# Patient Record
Sex: Female | Born: 2000 | Hispanic: Yes | Marital: Married | Smoking: Former smoker
Health system: Southern US, Community
[De-identification: ages and names within clinical notes are randomized; demographics above are authoritative.]

## PROBLEM LIST (undated history)

## (undated) ENCOUNTER — Inpatient Hospital Stay (HOSPITAL_COMMUNITY): Payer: Self-pay

## (undated) DIAGNOSIS — E282 Polycystic ovarian syndrome: Secondary | ICD-10-CM

## (undated) DIAGNOSIS — F419 Anxiety disorder, unspecified: Secondary | ICD-10-CM

## (undated) DIAGNOSIS — F909 Attention-deficit hyperactivity disorder, unspecified type: Secondary | ICD-10-CM

## (undated) DIAGNOSIS — F329 Major depressive disorder, single episode, unspecified: Secondary | ICD-10-CM

## (undated) DIAGNOSIS — F32A Depression, unspecified: Secondary | ICD-10-CM

## (undated) HISTORY — DX: Depression, unspecified: F32.A

## (undated) HISTORY — DX: Anxiety disorder, unspecified: F41.9

## (undated) HISTORY — PX: EYE MUSCLE SURGERY: SHX370

---
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
## 2019-07-10 ENCOUNTER — Ambulatory Visit
Admission: RE | Admit: 2019-07-10 | Discharge: 2019-07-10 | Disposition: A | Discharge status: Home / Self Care | Payer: Self-pay | Source: Ambulatory Visit | Attending: Family Medicine | Admitting: Family Medicine

## 2019-07-10 ENCOUNTER — Encounter: Payer: Self-pay | Admitting: Family Medicine

## 2019-07-10 ENCOUNTER — Other Ambulatory Visit: Payer: Self-pay

## 2019-07-10 ENCOUNTER — Ambulatory Visit (INDEPENDENT_AMBULATORY_CARE_PROVIDER_SITE_OTHER): Payer: Medicaid Other | Admitting: Family Medicine

## 2019-07-10 VITALS — BP 102/70 | HR 80 | Ht 64.0 in | Wt 173.1 lb

## 2019-07-10 DIAGNOSIS — Z23 Encounter for immunization: Secondary | ICD-10-CM

## 2019-07-10 DIAGNOSIS — M79672 Pain in left foot: Secondary | ICD-10-CM | POA: Diagnosis present

## 2019-07-10 DIAGNOSIS — Z0000 Encounter for general adult medical examination without abnormal findings: Secondary | ICD-10-CM | POA: Diagnosis not present

## 2019-07-10 DIAGNOSIS — F329 Major depressive disorder, single episode, unspecified: Secondary | ICD-10-CM | POA: Diagnosis not present

## 2019-07-10 DIAGNOSIS — F419 Anxiety disorder, unspecified: Secondary | ICD-10-CM

## 2019-07-10 DIAGNOSIS — Z8742 Personal history of other diseases of the female genital tract: Secondary | ICD-10-CM | POA: Diagnosis not present

## 2019-07-10 MED ORDER — BUSPIRONE HCL 5 MG PO TABS: 5.0000 mg | ORAL_TABLET | Freq: Every day | ORAL | 3 refills | Status: DC

## 2019-07-10 MED ORDER — NORGESTIM-ETH ESTRAD TRIPHASIC 0.18/0.215/0.25 MG-35 MCG PO TABS: 1.0000 | ORAL_TABLET | Freq: Every day | ORAL | 6 refills | Status: DC

## 2019-07-10 NOTE — Assessment & Plan Note (Addendum)
Patient not at age for pap smear as of yet. Blood pressure within normal range.   -Influenza vaccine given today  -collected recommended screen for HIV

## 2019-07-10 NOTE — Progress Notes (Signed)
  Patient Name: Molly Mendez Date of Birth: 01-22-2001 Date of Visit: 07/13/19 PCP: Stark Klein, MD  Chief Complaint: left foot pain  Subjective: Molly Mendez is a 18 y.o. with medical history significant for anxiety and depression presenting today for new patient appointment to establish care.   Foot Pain  RONNY RUDDELL states she has been experiencing pain in her left foot after a fall at work. This fall occurred 3-4 months ago while walking down the stairs and carrying a vacuum cleaner. Since the fall, patient reports experiencing plantar surface pain that is made worse with walking. She does not identify a particular point in her ambulation cycle where the pain is worse. She reports using vapor rub on her feet but has not had any relief with this. Patient has been wearing flip flops since the fall in order to prevent pain, she adds that at the time of the fall, her foot was swollen but not erythematous and she denies hearing any cracks or pops.   Hx of PCOS Patient reports being diagnosed with PCOS. She states that she recently lost weight with diet and exercise and is not currently prescribed an OCP. She reports hirsuitism. Patient states that she would like to be started on an OCP.   Hx of Anxiety and Depression  Patient states that she mostly experiences depression and was previously seen at [facility] for management of her buspirone medication. She reports that this prescription has been helpful with her anxiety. Patient is requesting refill for her buspirone.  PHQ9 score of 10.  GAD Score of 7.  I have reviewed the patient's medical, surgical, family, and social history as appropriate.  Vitals:   07/10/19 1358  BP: 102/70  Pulse: 80  SpO2: 99%    Physical Exam:   General: Alert and cooperative and appears to be in no acute distress HEENT: Neck non-tender without lymphadenopathy, masses or thyromegaly, turbinates do not appear edematous or  boggy, no oropharyngeal erythema or petechiae  Cardio: Normal S1 and S2, no S3 or S4. Rhythm is regular. No murmurs or rubs.   Pulm: Clear to auscultation bilaterally, no crackles, wheezing, or diminished breath sounds. Normal respiratory effort Abdomen: Bowel sounds normal. Abdomen soft and non-tender.  Extremities: No peripheral edema. Warm/ well perfused.  Strong radial and pedal pulses.Left ankles with no tenderness or erythema, tenderness in plantar aspect of left foot, patient noted to walk with painful gait  Assessment & Plan:   Healthcare maintenance Patient not at age for pap smear as of yet. Blood pressure within normal range.   -Influenza vaccine given today  -collected recommended screen for HIV   Foot pain, left Patient reports foot pain for 3-4 months falling a fall. Patient reports pain with walking throughout cycle of foot strike, pain is concentrated in area of plantar fascia.  -encourage patient to try Tylenol, RICE method and stretching  -left foot xray (no fracture or dislocation) -ambulatory referral to sports medicine   History of PCOS Patient reports history of PCOS and reports not currently being on contraception. Patient also reports having vaginal bleeding twice per month with hirsutism.  -prescribe Sprintec   Anxiety and depression Patient controlled on Buspirone 5mg  daily. Refills submitted.   Return to care as needed.   Stark Klein, MD  Family Medicine  PGY1

## 2019-07-10 NOTE — Patient Instructions (Addendum)
It was a pleasure to see you today! Thank you for choosing [facility] for your primary care. Molly Mendez was seen for new patient appointment to establish care.   Our plans for today were:  PCOS- sending a prescription for birth control.   Foot Pain- I am referring you to sports medicine to further evaluate your foot pain. I have also sent in a request for you to have an xray of your foot.    I recommend that you use flonase as we discussed to help with clearing your nasal passages. Please follow up with me in 2 weeks and we can further discuss this if there are no changes.   To keep you healthy, we need to monitor some screening tests.   You are due for :  1. Influenza - given today  2. HIV screening, completed today.    You should return to our clinic in 2 weeks to follow up on your new medications.   Best,  Dr. Rosita Fire

## 2019-07-11 LAB — HIV ANTIBODY (ROUTINE TESTING W REFLEX): HIV Screen 4th Generation wRfx: NONREACTIVE

## 2019-07-11 NOTE — Progress Notes (Signed)
Patient called and notified of results of xray of her foot showing no fracture or dislocation. Patient verbalized understanding.

## 2019-07-13 ENCOUNTER — Encounter: Payer: Self-pay | Admitting: Family Medicine

## 2019-07-13 DIAGNOSIS — Z8742 Personal history of other diseases of the female genital tract: Secondary | ICD-10-CM | POA: Insufficient documentation

## 2019-07-13 DIAGNOSIS — F419 Anxiety disorder, unspecified: Secondary | ICD-10-CM | POA: Insufficient documentation

## 2019-07-13 DIAGNOSIS — M79672 Pain in left foot: Secondary | ICD-10-CM | POA: Insufficient documentation

## 2019-07-13 DIAGNOSIS — F329 Major depressive disorder, single episode, unspecified: Secondary | ICD-10-CM | POA: Insufficient documentation

## 2019-07-13 NOTE — Assessment & Plan Note (Signed)
Patient reports foot pain for 3-4 months falling a fall. Patient reports pain with walking throughout cycle of foot strike, pain is concentrated in area of plantar fascia.  -encourage patient to try Tylenol, RICE method and stretching  -left foot xray (no fracture or dislocation) -ambulatory referral to sports medicine

## 2019-07-13 NOTE — Assessment & Plan Note (Addendum)
Patient controlled on Buspirone 5mg  daily. Refills submitted.

## 2019-07-13 NOTE — Assessment & Plan Note (Signed)
Patient reports history of PCOS and reports not currently being on contraception. Patient also reports having vaginal bleeding twice per month with hirsutism.  -prescribe Sprintec

## 2019-07-17 ENCOUNTER — Ambulatory Visit: Payer: Medicaid Other | Admitting: Pediatrics

## 2019-07-24 ENCOUNTER — Other Ambulatory Visit: Payer: Self-pay

## 2019-07-24 ENCOUNTER — Ambulatory Visit (INDEPENDENT_AMBULATORY_CARE_PROVIDER_SITE_OTHER): Payer: Medicaid Other | Admitting: Student in an Organized Health Care Education/Training Program

## 2019-07-24 ENCOUNTER — Encounter: Payer: Self-pay | Admitting: Student in an Organized Health Care Education/Training Program

## 2019-07-24 VITALS — BP 118/70 | HR 92

## 2019-07-24 DIAGNOSIS — L659 Nonscarring hair loss, unspecified: Secondary | ICD-10-CM

## 2019-07-24 NOTE — Patient Instructions (Signed)
It was a pleasure to see you today!  To summarize our discussion for this visit:  For your hair loss, I think this is less likely caused by your birth control.  At this time I would recommend taking a multivitamin and checking your thyroid hormone.  If you do not see improvement then please let [place] know and we can change her birth control.  For your skin rash I am going to prescribe you an antifungal medication.  If this does not improve it please come back and let us know.  I am refilling your omeprazole.   Call the clinic at [phone] if your symptoms worsen or you have any concerns.   Thank you for allowing me to take part in your care,  Dr. Doristine Mango

## 2019-07-24 NOTE — Progress Notes (Signed)
   Subjective:    Patient ID: Molly Mendez, female    DOB: Feb 11, 2001, 18 y.o.   MRN: [id number]  CC: Hair loss, rash, omeprazole refill  HPI:  Hair loss- endorses 2 weeks of increased hair loss without bald spots. Attributes to birth control pill prescribed 10/15. Notices more hair pulls out after showers. Has not noticed excess hair on clothing or pillow, does not break off when brushing hair. Does not wear hair in tight braids. No noticed hairloss from other parts of the body. Patient currently on period which is described as heavy. Patient has lost ~37 lbs in 4 months intentionally to treat PCOS. Eats ~1,900 calories per day and performs HIIT workouts 1+ times every day. Feels that she is more hyper than usual, no change in sleep habits. Also had a rash appear under breasts bilaterally 2 days ago which she attributes to using a new loofa. She discontinued use of the loofa and the rash has resolved. The rash would burn when she scratched it.   She also needs a refill of her omeprazole. This is not a medication on her medication list.   Smoking status reviewed   ROS: pertinent noted in the HPI   I have personally reviewed pertinent past medical history, surgical, family, and social history as appropriate.  Objective:  BP 118/70   Pulse 92   LMP 06/26/2019   SpO2 99%   Vitals and nursing note reviewed  General: NAD, pleasant, able to participate in exam Head: negative for patches or diffuse reduced scalp.  Cardiac: regular and slightly tachycardic, S1 S2 present. normal heart sounds, no murmurs. Respiratory: CTAB, normal effort, No wheezes, rales or rhonchi. No increased WOB Extremities: no edema or cyanosis. Skin: warm and dry, no rashes noted except-. Inframammary folds positive for raised erythematous rash with satellite lesions.  Neuro: alert, no obvious focal deficits Psych: Normal affect and mood  Assessment & Plan:   Hair loss Unclear etiology. Negative for  visible thinning on scalp. Checked TSH, CBC.  Recommended multivitamin Follow up if continues for repeat exam to see evidence of hair loss to further differentiate. Would recommend continuing birth control for now.  Orders Placed This Encounter  Procedures  . CBC  . TSH    Doristine Mango, [facility] PGY-2

## 2019-07-25 LAB — CBC
Hematocrit: 37.4 % (ref 34.0–46.6)
Hemoglobin: 12.3 g/dL (ref 11.1–15.9)
MCH: 25.9 pg — ABNORMAL LOW (ref 26.6–33.0)
MCHC: 32.9 g/dL (ref 31.5–35.7)
MCV: 79 fL (ref 79–97)
Platelets: 297 10*3/uL (ref 150–450)
RBC: 4.75 x10E6/uL (ref 3.77–5.28)
RDW: 12.9 % (ref 11.7–15.4)
WBC: 8 10*3/uL (ref 3.4–10.8)

## 2019-07-25 LAB — TSH: TSH: 1.89 u[IU]/mL (ref 0.450–4.500)

## 2019-07-28 DIAGNOSIS — L659 Nonscarring hair loss, unspecified: Secondary | ICD-10-CM | POA: Insufficient documentation

## 2019-07-28 MED ORDER — MICONAZOLE NITRATE 2 % EX CREA: 1.0000 "application " | TOPICAL_CREAM | Freq: Two times a day (BID) | CUTANEOUS | 0 refills | Status: DC

## 2019-07-28 NOTE — Assessment & Plan Note (Signed)
Unclear etiology. Negative for visible thinning on scalp. Checked TSH, CBC.  Recommended multivitamin Follow up if continues for repeat exam to see evidence of hair loss to further differentiate. Would recommend continuing birth control for now.

## 2020-02-25 ENCOUNTER — Other Ambulatory Visit: Payer: Self-pay

## 2020-02-25 ENCOUNTER — Ambulatory Visit (INDEPENDENT_AMBULATORY_CARE_PROVIDER_SITE_OTHER): Payer: Medicaid Other | Admitting: Family Medicine

## 2020-02-25 VITALS — BP 115/75 | HR 74 | Ht 62.0 in | Wt 177.4 lb

## 2020-02-25 DIAGNOSIS — R300 Dysuria: Secondary | ICD-10-CM

## 2020-02-25 DIAGNOSIS — N3000 Acute cystitis without hematuria: Secondary | ICD-10-CM

## 2020-02-25 DIAGNOSIS — N390 Urinary tract infection, site not specified: Secondary | ICD-10-CM | POA: Insufficient documentation

## 2020-02-25 HISTORY — DX: Urinary tract infection, site not specified: N39.0

## 2020-02-25 LAB — POCT UA - MICROSCOPIC ONLY

## 2020-02-25 LAB — POCT URINALYSIS DIP (MANUAL ENTRY)
Bilirubin, UA: NEGATIVE
Blood, UA: NEGATIVE
Glucose, UA: NEGATIVE mg/dL
Ketones, POC UA: NEGATIVE mg/dL
Leukocytes, UA: NEGATIVE
Nitrite, UA: POSITIVE — AB
Protein Ur, POC: NEGATIVE mg/dL
Spec Grav, UA: 1.02 (ref 1.010–1.025)
Urobilinogen, UA: 0.2 E.U./dL
pH, UA: 6 (ref 5.0–8.0)

## 2020-02-25 MED ORDER — CEPHALEXIN 500 MG PO CAPS: 500.0000 mg | ORAL_CAPSULE | Freq: Four times a day (QID) | ORAL | 0 refills | Status: AC

## 2020-02-25 NOTE — Progress Notes (Signed)
° ° °  SUBJECTIVE:   CHIEF COMPLAINT / HPI:   Dysuria  Patient noted 3 days ago she noted irritation with urination. First felt like itching but now is burning. Does note urinary frequency but no urgency. No hematuria. No vaginal discharge. Never has had a UTI. Did change to a scented soap recently. No rashes or sores. Patient is sexually active with same partner. No STD exposure. Always uses condoms. Not on birth control, would like to think about it before deciding on which form she wants. No fevers. No new back pain.   PERTINENT  PMH / PSH: h/o PCOS  OBJECTIVE:   BP 115/75    Pulse 74    Ht 5\' 2"  (1.575 m)    Wt 177 lb 6.4 oz (80.5 kg)    SpO2 99%    BMI 32.45 kg/m   Gen: awake and alert, NAD Cardio: RRR, no MRG Resp: CTAB, no wheezes, rales or rhonchi GI: soft, non tender, non distended, bowel sounds present Ext: no edema  ASSESSMENT/PLAN:   UTI (urinary tract infection) Patient with symptoms of dysuria as well as urinary frequency.  UA showing positive nitrites.  Likely UTI.  Will treat with Keflex.  Follow-up if no improvement.     , DO [facility]

## 2020-02-25 NOTE — Progress Notes (Signed)
dip 

## 2020-02-25 NOTE — Assessment & Plan Note (Signed)
Patient with symptoms of dysuria as well as urinary frequency.  UA showing positive nitrites.  Likely UTI.  Will treat with Keflex.  Follow-up if no improvement.

## 2020-02-25 NOTE — Patient Instructions (Signed)
Urinary Tract Infection, Adult A urinary tract infection (UTI) is an infection of any part of the urinary tract. The urinary tract includes:  The kidneys.  The ureters.  The bladder.  The urethra. These organs make, store, and get rid of pee (urine) in the body. What are the causes? This is caused by germs (bacteria) in your genital area. These germs grow and cause swelling (inflammation) of your urinary tract. What increases the risk? You are more likely to develop this condition if:  You have a small, thin tube (catheter) to drain pee.  You cannot control when you pee or poop (incontinence).  You are female, and: ? You use these methods to prevent pregnancy:  A medicine that kills sperm (spermicide).  A device that blocks sperm (diaphragm). ? You have low levels of a female hormone (estrogen). ? You are pregnant.  You have genes that add to your risk.  You are sexually active.  You take antibiotic medicines.  You have trouble peeing because of: ? A prostate that is bigger than normal, if you are female. ? A blockage in the part of your body that drains pee from the bladder (urethra). ? A kidney stone. ? A nerve condition that affects your bladder (neurogenic bladder). ? Not getting enough to drink. ? Not peeing often enough.  You have other conditions, such as: ? Diabetes. ? A weak disease-fighting system (immune system). ? Sickle cell disease. ? Gout. ? Injury of the spine. What are the signs or symptoms? Symptoms of this condition include:  Needing to pee right away (urgently).  Peeing often.  Peeing small amounts often.  Pain or burning when peeing.  Blood in the pee.  Pee that smells bad or not like normal.  Trouble peeing.  Pee that is cloudy.  Fluid coming from the vagina, if you are female.  Pain in the belly or lower back. Other symptoms include:  Throwing up (vomiting).  No urge to eat.  Feeling mixed up (confused).  Being tired  and grouchy (irritable).  A fever.  Watery poop (diarrhea). How is this treated? This condition may be treated with:  Antibiotic medicine.  Other medicines.  Drinking enough water. Follow these instructions at home:  Medicines  Take over-the-counter and prescription medicines only as told by your doctor.  If you were prescribed an antibiotic medicine, take it as told by your doctor. Do not stop taking it even if you start to feel better. General instructions  Make sure you: ? Pee until your bladder is empty. ? Do not hold pee for a long time. ? Empty your bladder after sex. ? Wipe from front to back after pooping if you are a female. Use each tissue one time when you wipe.  Drink enough fluid to keep your pee pale yellow.  Keep all follow-up visits as told by your doctor. This is important. Contact a doctor if:  You do not get better after 1-2 days.  Your symptoms go away and then come back. Get help right away if:  You have very bad back pain.  You have very bad pain in your lower belly.  You have a fever.  You are sick to your stomach (nauseous).  You are throwing up. Summary  A urinary tract infection (UTI) is an infection of any part of the urinary tract.  This condition is caused by germs in your genital area.  There are many risk factors for a UTI. These include having a small, thin   tube to drain pee and not being able to control when you pee or poop.  Treatment includes antibiotic medicines for germs.  Drink enough fluid to keep your pee pale yellow. This information is not intended to replace advice given to you by your health care provider. Make sure you discuss any questions you have with your health care provider. Document Revised: 08/29/2018 Document Reviewed: 03/21/2018 Elsevier Patient Education  2020 Elsevier Inc.  

## 2020-03-08 ENCOUNTER — Other Ambulatory Visit: Payer: Self-pay

## 2020-03-08 ENCOUNTER — Ambulatory Visit (INDEPENDENT_AMBULATORY_CARE_PROVIDER_SITE_OTHER): Payer: Medicaid Other | Admitting: Student in an Organized Health Care Education/Training Program

## 2020-03-08 DIAGNOSIS — K219 Gastro-esophageal reflux disease without esophagitis: Secondary | ICD-10-CM | POA: Insufficient documentation

## 2020-03-08 DIAGNOSIS — Z8742 Personal history of other diseases of the female genital tract: Secondary | ICD-10-CM

## 2020-03-08 DIAGNOSIS — N898 Other specified noninflammatory disorders of vagina: Secondary | ICD-10-CM | POA: Diagnosis not present

## 2020-03-08 DIAGNOSIS — R300 Dysuria: Secondary | ICD-10-CM

## 2020-03-08 LAB — POCT URINALYSIS DIP (MANUAL ENTRY)
Bilirubin, UA: NEGATIVE
Blood, UA: NEGATIVE
Glucose, UA: NEGATIVE mg/dL
Ketones, POC UA: NEGATIVE mg/dL
Leukocytes, UA: NEGATIVE
Nitrite, UA: NEGATIVE
Protein Ur, POC: NEGATIVE mg/dL
Spec Grav, UA: >=1.03 — AB (ref 1.010–1.025)
Urobilinogen, UA: 2 E.U./dL — AB
pH, UA: 6.5 (ref 5.0–8.0)

## 2020-03-08 LAB — POCT URINE PREGNANCY: Preg Test, Ur: NEGATIVE

## 2020-03-08 MED ORDER — NORGESTIMATE-ETH ESTRADIOL 0.25-35 MG-MCG PO TABS: 1.0000 | ORAL_TABLET | Freq: Every day | ORAL | 5 refills | Status: DC

## 2020-03-08 MED ORDER — FLUCONAZOLE 150 MG PO TABS: 150.0000 mg | ORAL_TABLET | Freq: Once | ORAL | 0 refills | Status: AC

## 2020-03-08 MED ORDER — FAMOTIDINE 20 MG PO TABS: 20.0000 mg | ORAL_TABLET | Freq: Two times a day (BID) | ORAL | 0 refills | Status: DC

## 2020-03-08 NOTE — Patient Instructions (Addendum)
I am sorry about the confusion.   Your urine did not show signs of infection and your pregnancy test was negative (not pregnant.)   It looks like Dr. Dareen Piano wanted to treat you for a yeast infection of the vagina - a one pill, one time treatment. It also looks like she wanted to treat your reflux with Pepcid (famotidine). I also sent in a prescription for diflucan, the yeast treatment.

## 2020-03-08 NOTE — Progress Notes (Signed)
   SUBJECTIVE:   CHIEF COMPLAINT / HPI: vaginal irritation  Vaginal irritation- dysuria present prior to last appointment and was treated with keflex. Patient endorses taking the full treatment which improved her symptoms but has now had a different irritation present only when she wipes. Denies increased discharge, pruritus, bleeding. Has avoided scratching. Has stopped using topical scented soaps. Patient wipes from back to front after urinating.  Birth control- patient endorses history of PCOS and used to be on depo shot which did not help her. We discussed indication for combination birth control if desire to improve symptoms and patient endorsed that she is able to reliably take a OCP daily. She will continue to use condoms. States that her and her partner are careful and use condoms every time.  GERD- history if GERD on omeprazole which improved her symptoms but discontinued about a year ago and has recently started having symptoms again. Has not tried anything else. Has been avoiding triggering foods such as pork.   PERTINENT  PMH / PSH: GERD, PCOS  OBJECTIVE:   BP 104/62   Pulse 62   Wt 177 lb 9.6 oz (80.6 kg)   SpO2 97%   BMI 32.48 kg/m   General: NAD, pleasant, able to participate in exam Abdomen: soft, nontender, nondistended, no hepatic or splenomegaly, +BS Extremities: no edema or cyanosis. WWP. Skin: warm and dry, no rashes noted Neuro: alert and oriented x4, no focal deficits Psych: Normal affect and mood  ASSESSMENT/PLAN:   Vaginal irritation Repeat urinalysis to assess for resistance to treatment. Suspect yeast given recent antibiotic use If urinalysis, upreg negative, will prescribe diflucan  History of PCOS Discussed diet changes, exercise, weight loss as well as prescribed combination OCP  GERD (gastroesophageal reflux disease) Previously on omeprazole which helped.  recurrence of similar symptoms despite trigger avoidance Prescribing trial of  pepcid. Follow up with pcp if not improved     Leeroy Bock, DO [facility]

## 2020-03-08 NOTE — Assessment & Plan Note (Signed)
Discussed diet changes, exercise, weight loss as well as prescribed combination OCP

## 2020-03-08 NOTE — Assessment & Plan Note (Signed)
Previously on omeprazole which helped.  recurrence of similar symptoms despite trigger avoidance Prescribing trial of pepcid. Follow up with pcp if not improved

## 2020-03-08 NOTE — Assessment & Plan Note (Addendum)
Repeat urinalysis to assess for resistance to treatment. Suspect yeast given recent antibiotic use If urinalysis, upreg negative, will prescribe diflucan

## 2020-04-05 ENCOUNTER — Ambulatory Visit: Payer: Medicaid Other

## 2020-04-05 NOTE — Progress Notes (Deleted)
    SUBJECTIVE:   CHIEF COMPLAINT / HPI:   Hives Patient is a 19 year old female presenting today to discuss "breaking out in hives".  PERTINENT  PMH / PSH: ***  OBJECTIVE:   There were no vitals taken for this visit.   General: NAD, pleasant, able to participate in exam Cardiac: RRR, no murmurs. Respiratory: CTAB, normal effort, No wheezes, rales or rhonchi Abdomen: Bowel sounds present, nontender, nondistended, no hepatosplenomegaly. Extremities: no edema or cyanosis. Skin: warm and dry, no rashes noted Neuro: alert, no obvious focal deficits Psych: Normal affect and mood  ASSESSMENT/PLAN:   No problem-specific Assessment & Plan notes found for this encounter.     Jackelyn Poling, DO [facility]

## 2020-04-29 ENCOUNTER — Ambulatory Visit (INDEPENDENT_AMBULATORY_CARE_PROVIDER_SITE_OTHER): Payer: Medicaid Other | Admitting: Student in an Organized Health Care Education/Training Program

## 2020-04-29 ENCOUNTER — Other Ambulatory Visit: Payer: Self-pay

## 2020-04-29 VITALS — BP 100/68 | HR 75

## 2020-04-29 DIAGNOSIS — R0602 Shortness of breath: Secondary | ICD-10-CM | POA: Insufficient documentation

## 2020-04-29 MED ORDER — FLUTICASONE PROPIONATE 50 MCG/ACT NA SUSP: 2.0000 | Freq: Every day | NASAL | 6 refills | Status: DC

## 2020-04-29 NOTE — Assessment & Plan Note (Addendum)
Patient is Covid vaccinated so infection is less likely despite close contacts -Prescribed Flonase for symptomatic relief. -Patient has no dyspnea with walking through the clinic and has good oxygen saturation -Recommended staying home and isolating until can be Covid tested

## 2020-04-29 NOTE — Progress Notes (Signed)
    SUBJECTIVE:   CHIEF COMPLAINT / HPI: ill  Symptoms started on Saturday while exerting herself at work. First noticed having difficulty with breathing.  Subsequently, she started having body aches, sore throat, night sweats, dry coughing. The Sob is even when she is resting.  Tried teraflu tea and cough drops and those have not helped. Has noticed decrease in smell and taste. She has sinus congestion with nasal drainage. Night shift workers were all tested positive for COVID- patient was told last night when she was working.  Patient has received covid vaccine. Pfizer 01/29/20, 02/19/20.  OBJECTIVE:   BP 100/68   Pulse 75   SpO2 98%   General: NAD, pleasant, able to participate in exam HEENT: dried white mucus in bilateral nares.  Oropharynx negative for erythema, edema, exudates. Neck positive for tender anterior cervical lymphadenitis. Respiratory: CTAB, normal effort, No wheezes, rales or rhonchi.  No conversational dyspnea Extremities: no edema or cyanosis. WWP. Skin: warm and dry, no rashes noted Neuro: alert and oriented Psych: Normal affect and mood  ASSESSMENT/PLAN:   Shortness of breath Patient is Covid vaccinated so infection is less likely despite close contacts -Prescribed Flonase for symptomatic relief. -Patient has no dyspnea with walking through the clinic and has good oxygen saturation -Recommended staying home and isolating until can be Covid tested     Leeroy Bock, DO [facility]

## 2020-04-29 NOTE — Patient Instructions (Signed)
It was a pleasure to see you today!  To summarize our discussion for this visit:  I'm glad that you are vaccinated against Covid.  Your symptoms could be due to any number of viruses but since you had several coworkers test positive for Covid at that same time that you start experiencing symptoms, I would recommend that you get Covid tested.  You can find the Covid testing facilities by going to [email or web address]  Please stay home from work and isolate until you have the results of this test.  You can also get tested at several other locations.  I have sent in Flonase to help with your congestion.  Please go to the emergency department if you are having difficulty breathing.  Some additional health maintenance measures we should update are: Health Maintenance Due  Topic Date Due  . Hepatitis C Screening  Never done  . TETANUS/TDAP  Never done  . INFLUENZA VACCINE  04/25/2020     Call the clinic at [phone] if your symptoms worsen or you have any concerns.   Thank you for allowing me to take part in your care,  Dr. Jamelle Rushing

## 2020-06-11 ENCOUNTER — Ambulatory Visit: Payer: Medicaid Other

## 2020-07-22 ENCOUNTER — Ambulatory Visit: Payer: Medicaid Other | Admitting: Family Medicine

## 2020-08-24 NOTE — Progress Notes (Addendum)
    SUBJECTIVE:   CHIEF COMPLAINT / HPI:   [name] is a 19 yo F who presents for the issue below.   Possible UTI Experiencing back pain, polyuria, and dysuria for 1 week. Denies fever, vaginal odor, or discharge.   Hx of depression Would like to speak with a therapist. Was previously taking medication 1 year ago and would like to start therapy before restarting medication. Denies SI/HI.   PERTINENT  PMH / PSH: hx of UTI 6 months prior, vaginal irritation  OBJECTIVE:   BP 118/74   Pulse 72   Wt 178 lb 6.4 oz (80.9 kg)   SpO2 98%   BMI 32.63 kg/m   Results for orders placed or performed in visit on 08/25/20 (from the past 24 hour(s))  POCT urinalysis dipstick     Status: Abnormal   Collection Time: 08/25/20 11:25 AM  Result Value Ref Range   Color, UA yellow yellow   Clarity, UA clear clear   Glucose, UA negative negative mg/dL   Bilirubin, UA negative negative   Ketones, POC UA trace (5) (A) negative mg/dL   Spec Grav, UA >=6.578 (A) 1.010 - 1.025   Blood, UA negative negative   pH, UA 5.5 5.0 - 8.0   Protein Ur, POC negative negative mg/dL   Urobilinogen, UA 0.2 0.2 or 1.0 E.U./dL   Nitrite, UA Negative Negative   Leukocytes, UA Negative Negative   PHQ9 SCORE ONLY 08/25/2020 03/08/2020 07/24/2019  PHQ-9 Total Score 4 0 7    General: Appears well, no acute distress. Age appropriate. Respiratory: normal effort Abdomen: soft, suprapubic tenderness to palpation, nondistended Back/ Extremities: Endorsing CVA tenderness  ASSESSMENT/PLAN:  Suspected UTI UA unremarkable as above. Symptoms and PE suggestive of UTI. Will treat and culture.  - Urine Culture - cephALEXin (KEFLEX) 500 MG capsule; Take 1 capsule (500 mg total) by mouth 2 (two) times daily for 7 days. (Patient not taking: Reported on 08/25/2020)  Dispense: 14 capsule; Refill: 0  Depression, major, single episode, mild (HCC) Ongoing. Mild according to PHQ9. -Discussed resource  [email or web address] -consider restarting medication after getting connected with therapist  Molly Jumbo, DO [facility]

## 2020-08-25 ENCOUNTER — Other Ambulatory Visit: Payer: Self-pay

## 2020-08-25 ENCOUNTER — Ambulatory Visit (INDEPENDENT_AMBULATORY_CARE_PROVIDER_SITE_OTHER): Payer: Medicaid Other | Admitting: Family Medicine

## 2020-08-25 ENCOUNTER — Encounter: Payer: Self-pay | Admitting: Family Medicine

## 2020-08-25 VITALS — BP 118/74 | HR 72 | Wt 178.4 lb

## 2020-08-25 DIAGNOSIS — R3989 Other symptoms and signs involving the genitourinary system: Secondary | ICD-10-CM

## 2020-08-25 DIAGNOSIS — F320 Major depressive disorder, single episode, mild: Secondary | ICD-10-CM

## 2020-08-25 LAB — POCT URINALYSIS DIP (MANUAL ENTRY)
Bilirubin, UA: NEGATIVE
Blood, UA: NEGATIVE
Glucose, UA: NEGATIVE mg/dL
Leukocytes, UA: NEGATIVE
Nitrite, UA: NEGATIVE
Protein Ur, POC: NEGATIVE mg/dL
Spec Grav, UA: >=1.03 — AB (ref 1.010–1.025)
Urobilinogen, UA: 0.2 E.U./dL
pH, UA: 5.5 (ref 5.0–8.0)

## 2020-08-25 MED ORDER — CEPHALEXIN 500 MG PO CAPS: 500.0000 mg | ORAL_CAPSULE | Freq: Two times a day (BID) | ORAL | 0 refills | Status: AC

## 2020-08-25 NOTE — Patient Instructions (Addendum)
It was wonderful to see you today.  Today we talked about:  Possible UTI. We will culture your urine and let you know your results. Start taking keflex 500 mg twice daily for a total of 7 days.  Connecting with a therapist through [email or web address]  Please call the clinic at [phone] if you have any concerns. It was our pleasure to serve you.  Dr. Salvadore Dom

## 2020-08-25 NOTE — Assessment & Plan Note (Addendum)
Ongoing. Mild according to PHQ9. -Discussed resource [email or web address] -consider restarting medication after getting connected with therapist

## 2020-12-14 ENCOUNTER — Ambulatory Visit (INDEPENDENT_AMBULATORY_CARE_PROVIDER_SITE_OTHER): Payer: Medicaid Other | Admitting: Family Medicine

## 2020-12-14 DIAGNOSIS — Z5329 Procedure and treatment not carried out because of patient's decision for other reasons: Secondary | ICD-10-CM

## 2020-12-14 NOTE — Progress Notes (Signed)
Patient no showed her appointment today 12/14/2020.  Peggyann Shoals, DO [facility], PGY-3 12/14/2020 12:57 PM

## 2021-01-26 ENCOUNTER — Other Ambulatory Visit: Payer: Self-pay

## 2021-01-26 ENCOUNTER — Ambulatory Visit (INDEPENDENT_AMBULATORY_CARE_PROVIDER_SITE_OTHER): Payer: Medicaid Other | Admitting: Family Medicine

## 2021-01-26 VITALS — Ht 62.0 in | Wt 183.1 lb

## 2021-01-26 DIAGNOSIS — M79671 Pain in right foot: Secondary | ICD-10-CM | POA: Insufficient documentation

## 2021-01-26 NOTE — Progress Notes (Signed)
    SUBJECTIVE:   CHIEF COMPLAINT / HPI:   Right Foot Pain Patient presents with right foot pain. Pain is located on dorsal aspect of her foot, below her great toe. States she dropped a suitcase on her foot 3 weeks ago. The area was swollen and "turned a little purple" after the injury but that went away after 1-2 weeks. Now she only has pain when she wears certain shoes (high heels or flats, which she is required to wear for work). No pain currently while wearing sandals in the office. No difficulty weight bearing.  PERTINENT  PMH / PSH: PCOS, anxiety  OBJECTIVE:   Ht 5\' 2"  (1.575 m)   Wt 183 lb 2 oz (83.1 kg)   LMP 01/08/2021   BMI 33.49 kg/m   Gen: alert, well-appearing, NAD MSK: Medial aspect of right dorsal midfoot with minimal edema. Nontender to palpation throughout. No bruising or deformities noted. Full ROM of bilateral feet and ankles. Neurovascularly intact. Normal gait.  ASSESSMENT/PLAN:   Right foot pain Acute s/p injury in which suitcase fell on her foot. Symptoms nearly resolved. Patient only has pain when wearing high heels or flats (which she is required to wear for work as an 01/10/2021). Do not suspect fracture as there is no tenderness on exam and no difficulty weight bearing. -Reassurance provided -Supportive care with ice, ibuprofen prn -Avoid high heels, recommended supportive shoes when possible     Pensions consultant, MD [facility]

## 2021-01-26 NOTE — Patient Instructions (Addendum)
It was great to meet you!  I don't think you have a fracture (break) in your foot. It is most likely a deep bruise and should get better with time.  You can take Ibuprofen as needed for pain. You can also apply ice to the area. Try to wear supportive shoes whenever possible. I would avoid wearing high heels for at least the next 2 weeks.  If your pain gets worse, you have trouble walking, or other concerns arise please call the office and we'd be happy to re-evaluate.   Take care,  Dr. [facility]

## 2021-01-26 NOTE — Assessment & Plan Note (Signed)
Acute s/p injury in which suitcase fell on her foot. Symptoms nearly resolved. 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 Do not suspect fracture as there is no tenderness on exam and no difficulty weight bearing. -Reassurance provided -Supportive care with ice, ibuprofen prn -Avoid high heels, recommended supportive shoes when possible

## 2021-05-18 ENCOUNTER — Ambulatory Visit (INDEPENDENT_AMBULATORY_CARE_PROVIDER_SITE_OTHER): Payer: Medicaid Other | Admitting: Family Medicine

## 2021-05-18 ENCOUNTER — Other Ambulatory Visit (HOSPITAL_COMMUNITY)
Admission: RE | Admit: 2021-05-18 | Discharge: 2021-05-18 | Disposition: A | Discharge status: Home / Self Care | Payer: Medicaid Other | Source: Ambulatory Visit | Attending: Family Medicine | Admitting: Family Medicine

## 2021-05-18 ENCOUNTER — Other Ambulatory Visit: Payer: Self-pay

## 2021-05-18 VITALS — BP 126/79 | HR 64 | Ht 63.0 in | Wt 179.6 lb

## 2021-05-18 DIAGNOSIS — Z1159 Encounter for screening for other viral diseases: Secondary | ICD-10-CM | POA: Diagnosis not present

## 2021-05-18 DIAGNOSIS — R350 Frequency of micturition: Secondary | ICD-10-CM | POA: Diagnosis not present

## 2021-05-18 DIAGNOSIS — N912 Amenorrhea, unspecified: Secondary | ICD-10-CM

## 2021-05-18 DIAGNOSIS — N898 Other specified noninflammatory disorders of vagina: Secondary | ICD-10-CM

## 2021-05-18 LAB — POCT URINALYSIS DIP (MANUAL ENTRY)
Bilirubin, UA: NEGATIVE
Glucose, UA: NEGATIVE mg/dL
Ketones, POC UA: NEGATIVE mg/dL
Leukocytes, UA: NEGATIVE
Nitrite, UA: NEGATIVE
Protein Ur, POC: NEGATIVE mg/dL
Spec Grav, UA: 1.025 (ref 1.010–1.025)
Urobilinogen, UA: 1 E.U./dL
pH, UA: 5.5 (ref 5.0–8.0)

## 2021-05-18 LAB — POCT WET PREP (WET MOUNT)
Clue Cells Wet Prep Whiff POC: NEGATIVE
Trichomonas Wet Prep HPF POC: ABSENT

## 2021-05-18 LAB — POCT UA - MICROSCOPIC ONLY

## 2021-05-18 LAB — POCT URINE PREGNANCY: Preg Test, Ur: NEGATIVE

## 2021-05-18 NOTE — Patient Instructions (Signed)
It was great seeing you today!  Today you came in because of  missed menstrual cycle so we are checking a urine pregnancy test and blood test as well.  We also did a pelvic exam and are checking for STIs, and checking your urine for infection.  I will call you with any abnormal results.  Since you were not looking to get pregnant, we discussed contraception options which I have included more information below.  We will see you after November for your next annual exam and pap smear, but if you need to be seen earlier than that for any new issues we're happy to fit you in, just give [place] a call!   If you haven't already, sign up for My Chart to have easy access to your labs results, and communication with your primary care physician.  Feel free to call with any questions or concerns at any time, at [phone].   Take care,  Dr. Cora Collum [facility]    Contraception Choices Contraception, also called birth control, refers to methods or devices thatprevent pregnancy. Hormonal methods  Contraceptive implant A contraceptive implant is a thin, plastic tube that contains a hormone that prevents pregnancy. It is different from an intrauterine device (IUD). It is inserted into the upper part of the arm by a health care provider. Implants canbe effective for up to 3 years. Progestin-only injections Progestin-only injections are injections of progestin, a synthetic form of thehormone progesterone. They are given every 3 months by a health care provider. Birth control pills Birth control pills are pills that contain hormones that prevent pregnancy. They must be taken once a day, preferably at the same time each day. Aprescription is needed to use this method of contraception. Birth control patch The birth control patch contains hormones that prevent pregnancy. It is placed on the skin and must be changed once a week for three weeks and removed on thefourth week. A  prescription is needed to use this method of contraception. Vaginal ring A vaginal ring contains hormones that prevent pregnancy. It is placed in the vagina for three weeks and removed on the fourth week. After that, the process is repeated with a new ring. A prescription is needed to use this method ofcontraception. Emergency contraceptive Emergency contraceptives prevent pregnancy after unprotected sex. They come in pill form and can be taken up to 5 days after sex. They work best the sooner they are taken after having sex. Most emergency contraceptives are available without a prescription. This method should not be used as your only form ofbirth control. Barrier methods  Female condom A female condom is a thin sheath that is worn over the penis during sex. Condoms keep sperm from going inside a woman's body. They can be used with a sperm-killing substance (spermicide) to increase their effectiveness. They should be thrown away after one use. Female condom A female condom is a soft, loose-fitting sheath that is put into the vagina before sex. The condom keeps sperm from going inside a woman's body. Theyshould be thrown away after one use. Diaphragm A diaphragm is a soft, dome-shaped barrier. It is inserted into the vagina before sex, along with a spermicide. The diaphragm blocks sperm from entering the uterus, and the spermicide kills sperm. A diaphragm should be left in thevagina for 6-8 hours after sex and removed within 24 hours. A diaphragm is prescribed and fitted by a health care provider. A diaphragm should be replaced every 1-2 years, after  giving birth, after gaining more than15 lb (6.8 kg), and after pelvic surgery. Cervical cap A cervical cap is a round, soft latex or plastic cup that fits over the cervix. It is inserted into the vagina before sex, along with spermicide. It blocks sperm from entering the uterus. The cap should be left in place for 6-8 hours after sex and removed within 48  hours. A cervical cap must be prescribed andfitted by a health care provider. It should be replaced every 2 years. Sponge A sponge is a soft, circular piece of polyurethane foam with spermicide in it. The sponge helps block sperm from entering the uterus, and the spermicide kills sperm. To use it, you make it wet and then insert it into the vagina. It should be inserted before sex, left in for at least 6 hours after sex, and removed andthrown away within 30 hours. Spermicides Spermicides are chemicals that kill or block sperm from entering the cervix and uterus. They can come as a cream, jelly, suppository, foam, or tablet. A spermicide should be inserted into the vagina with an applicator at least 10-15 minutes before sex to allow time for it to work. The process must be repeatedevery time you have sex. Spermicides do not require a prescription. Intrauterine contraception Intrauterine device (IUD) An IUD is a T-shaped device that is put in a woman's uterus. There are two types: Hormone IUD.This type contains progestin, a synthetic form of the hormone progesterone. This type can stay in place for 3-5 years. Copper IUD.This type is wrapped in copper wire. It can stay in place for 10 years. Permanent methods of contraception Female tubal ligation In this method, a woman's fallopian tubes are sealed, tied, or blocked duringsurgery to prevent eggs from traveling to the uterus. Hysteroscopic sterilization In this method, a small, flexible insert is placed into each fallopian tube. The inserts cause scar tissue to form in the fallopian tubes and block them, so sperm cannot reach an egg. The procedure takes about 3 months to be effective.Another form of birth control must be used during those 3 months. Female sterilization This is a procedure to tie off the tubes that carry sperm (vasectomy). After the procedure, the man can still ejaculate fluid (semen). Another form of birth control must be used for 3  months after the procedure. Natural planning methods Natural family planning In this method, a couple does not have sex on days when the woman could become pregnant. Calendar method In this method, the woman keeps track of the length of each menstrual cycle, identifies the days when pregnancy can happen, and does not have sex on those days. Ovulation method In this method, a couple avoids sex during ovulation. Symptothermal method This method involves not having sex during ovulation. The woman typically checks for ovulation bywatching changes in her temperature and in the consistency of cervical mucus. Post-ovulation method In this method, a couple waits to have sex until after ovulation. Where to find more information Centers for Disease Control and Prevention: [employer] Summary Contraception, also called birth control, refers to methods or devices that prevent pregnancy. Hormonal methods of contraception include implants, injections, pills, patches, vaginal rings, and emergency contraceptives. Barrier methods of contraception can include female condoms, female condoms, diaphragms, cervical caps, sponges, and spermicides. There are two types of IUDs (intrauterine devices). An IUD can be put in a woman's uterus to prevent pregnancy for 3-5 years. Permanent sterilization can be done through a procedure for males and females. Natural family planning methods  involve nothaving sex on days when the woman could become pregnant. This information is not intended to replace advice given to you by your health care provider. Make sure you discuss any questions you have with your healthcare provider. Document Revised: 02/16/2020 Document Reviewed: 02/16/2020 Elsevier Patient Education  2022 [employer].

## 2021-05-18 NOTE — Progress Notes (Signed)
    SUBJECTIVE:   CHIEF COMPLAINT / HPI:   Molly Mendez is a 20 yo who presents for a pregnancy test. LMP July 9-12 Normally has cycles every month regularly. Not on birth control. Also endorses lower abdominal pain worsened when walking around. Sharp pain. Started last night. Additionally, states she is urinating more often. No burning. Was having some clear discharge. Does want to get STI testing   Denies nausea, vomiting, breast tenderness. Does feel lightheaded since last Thursday. Endorses occasional constipation. No blood in stool.   PERTINENT  PMH / PSH:  PCOS  OBJECTIVE:   BP 126/79   Pulse 64   Ht 5\' 3"  (1.6 m)   Wt 179 lb 9.6 oz (81.5 kg)   SpO2 100%   BMI 31.81 kg/m    Physical exam  General: well appearing, NAD Cardiovascular: RRR, no murmurs Lungs: CTAB. Normal WOB Abdomen: soft, non-distended, non-tender Skin: warm, dry. No edema normal external genitalia, vulva, vagina, cervix, uterus and adnexa. Scant clear discharge. No visible lesions.   ASSESSMENT/PLAN:   No problem-specific Assessment & Plan notes found for this encounter.   Amenorrhea Patient with late menstrual cycle this month.  States she has taken pregnancy test at home which were negative, but wants to get a blood test done today.   - urine pregnancy, and beta hCG.    Urinary frequency  Also endorses urinary frequency, and will check UA for infection.  Denies dysuria. She also requests STI testing.  - f/u UA, GC, wet prep - HIV, RPR  Health maintenance - Hep C screen   , DO [facility]

## 2021-05-19 LAB — HCV INTERPRETATION

## 2021-05-19 LAB — CERVICOVAGINAL ANCILLARY ONLY
Chlamydia: NEGATIVE
Comment: NEGATIVE
Comment: NORMAL
Neisseria Gonorrhea: NEGATIVE

## 2021-05-19 LAB — HIV ANTIBODY (ROUTINE TESTING W REFLEX): HIV Screen 4th Generation wRfx: NONREACTIVE

## 2021-05-19 LAB — HCV AB W REFLEX TO QUANT PCR: HCV Ab: <0.1 s/co ratio (ref 0.0–0.9)

## 2021-05-19 LAB — BETA HCG QUANT (REF LAB): hCG Quant: <1 m[IU]/mL

## 2021-05-20 ENCOUNTER — Encounter: Payer: Self-pay | Admitting: Family Medicine

## 2022-04-18 ENCOUNTER — Other Ambulatory Visit (HOSPITAL_COMMUNITY)
Admission: RE | Admit: 2022-04-18 | Discharge: 2022-04-18 | Disposition: A | Discharge status: Home / Self Care | Payer: Medicaid Other | Source: Ambulatory Visit | Attending: Family Medicine | Admitting: Family Medicine

## 2022-04-18 ENCOUNTER — Ambulatory Visit (INDEPENDENT_AMBULATORY_CARE_PROVIDER_SITE_OTHER): Payer: Medicaid Other | Admitting: Family Medicine

## 2022-04-18 VITALS — BP 128/85 | HR 84 | Wt 182.0 lb

## 2022-04-18 DIAGNOSIS — N3000 Acute cystitis without hematuria: Secondary | ICD-10-CM | POA: Diagnosis not present

## 2022-04-18 DIAGNOSIS — Z113 Encounter for screening for infections with a predominantly sexual mode of transmission: Secondary | ICD-10-CM | POA: Insufficient documentation

## 2022-04-18 DIAGNOSIS — R300 Dysuria: Secondary | ICD-10-CM | POA: Diagnosis present

## 2022-04-18 DIAGNOSIS — Z3200 Encounter for pregnancy test, result unknown: Secondary | ICD-10-CM | POA: Diagnosis not present

## 2022-04-18 DIAGNOSIS — Z0000 Encounter for general adult medical examination without abnormal findings: Secondary | ICD-10-CM | POA: Diagnosis not present

## 2022-04-18 LAB — POCT URINE PREGNANCY: Preg Test, Ur: NEGATIVE

## 2022-04-18 LAB — POCT URINALYSIS DIP (MANUAL ENTRY)
Bilirubin, UA: NEGATIVE
Glucose, UA: NEGATIVE mg/dL
Ketones, POC UA: NEGATIVE mg/dL
Leukocytes, UA: NEGATIVE
Nitrite, UA: NEGATIVE
Protein Ur, POC: NEGATIVE mg/dL
Spec Grav, UA: 1.015 (ref 1.010–1.025)
Urobilinogen, UA: 0.2 E.U./dL
pH, UA: 6.5 (ref 5.0–8.0)

## 2022-04-18 LAB — POCT UA - MICROSCOPIC ONLY
Epithelial cells, urine per micros: >20
WBC, Ur, HPF, POC: NONE SEEN (ref 0–5)

## 2022-04-18 NOTE — Patient Instructions (Signed)
Thank you for coming to see me today. It was a pleasure. You may have a UTI I will send in antibiotics if you do have one.  We performed STD testing today. This will take a few days to come back. If your MyChart is activated, we will message you on there if everything is normal, otherwise we will call. If we need to treat something we will also call you. If you do not hear from [place] in the next 4 days, please give [place] a call.   Best wishes,   Dr Allena Katz

## 2022-04-18 NOTE — Progress Notes (Signed)
     SUBJECTIVE:   CHIEF COMPLAINT / HPI:   Molly Mendez is a 21 y.o. female presents for dysuria   Pt reports a few day hx of dysuria and frequency. Denies fevers, back pain, lower abdominal pain urgency or hematuria. She thinks she has a UTI. Pt has a UTI usually once a yea.  No longer sexually active. Broke up with ex-fiance as he was cheating on her. Would like STD testing today.  Flowsheet Row Office Visit from 04/18/2022 in [facility]  PHQ-9 Total Score 6        Health Maintenance Due  Topic   HPV VACCINES (1 - 2-dose series)   TETANUS/TDAP    COVID-19 Vaccine (3 - Pfizer series)   PAP-Cervical Cytology Screening    PAP SMEAR-Modifier      PERTINENT  PMH / PSH: PCOS, GERD  OBJECTIVE:   BP 128/85   Pulse 84   Wt 182 lb (82.6 kg)   LMP 03/25/2022 (Approximate)   SpO2 100%   BMI 32.24 kg/m    General: Alert, no acute distress Cardio: well perfused  Pulm: normal work of breathing Neuro: Cranial nerves grossly intact    Pelvic Exam chaperoned by CMA Shelly         External: normal female genitalia without lesions or masses        Vagina: normal without lesions or masses, scant dark red blood present        Cervix: normal without lesions or masses        Pap smear: performed        Samples for Wet prep, GC/Chlamydia obtained   ASSESSMENT/PLAN:   UTI (urinary tract infection) UA neg for nitrites and leuks however urine cx grew gram negative rods 10,000-25,000 colonies. Low suspicion for pyelonephritis. Informed pt and sent in Keflex for 7 days. Return precautions given to pt.  Health maintenance examination Routine STD swabs today, wet prep plus RPR, HIV and Hep c. Obtained pap smear.    Molly Octave, MD PGY-3 [facility]

## 2022-04-19 LAB — HEPATITIS C ANTIBODY: Hep C Virus Ab: NONREACTIVE

## 2022-04-19 LAB — HIV ANTIBODY (ROUTINE TESTING W REFLEX): HIV Screen 4th Generation wRfx: NONREACTIVE

## 2022-04-19 LAB — RPR: RPR Ser Ql: NONREACTIVE

## 2022-04-20 ENCOUNTER — Other Ambulatory Visit: Payer: Self-pay | Admitting: Family Medicine

## 2022-04-20 DIAGNOSIS — Z0000 Encounter for general adult medical examination without abnormal findings: Secondary | ICD-10-CM | POA: Insufficient documentation

## 2022-04-20 LAB — CERVICOVAGINAL ANCILLARY ONLY
Chlamydia: NEGATIVE
Comment: NEGATIVE
Comment: NORMAL
Neisseria Gonorrhea: NEGATIVE

## 2022-04-20 MED ORDER — CEPHALEXIN 500 MG PO CAPS: 500.0000 mg | ORAL_CAPSULE | Freq: Four times a day (QID) | ORAL | 0 refills | Status: AC

## 2022-04-20 NOTE — Assessment & Plan Note (Addendum)
UA neg for nitrites and leuks however urine cx grew gram negative rods 10,000-25,000 colonies. Low suspicion for pyelonephritis. Informed pt and sent in Keflex for 7 days. Return precautions given to pt.

## 2022-04-20 NOTE — Assessment & Plan Note (Addendum)
Routine STD swabs today, wet prep plus RPR, HIV and Hep c. Obtained pap smear.

## 2022-04-21 LAB — URINE CULTURE

## 2022-04-24 ENCOUNTER — Encounter: Payer: Self-pay | Admitting: Family Medicine

## 2022-04-24 LAB — CYTOLOGY - PAP: Diagnosis: NEGATIVE

## 2022-10-10 ENCOUNTER — Telehealth: Payer: Self-pay

## 2022-10-10 NOTE — Telephone Encounter (Signed)
Patient calls nurse line requesting an apt.   Patient reports this morning she had one episode of "coughing up blood." She reports less than a "quarter size" on tissue paper.   Patient denies any fevers, SOB or chest pains at this time. She does report she had a cold ~1 week ago and unsure if the two are related.   She reports a hx of vaping, however reports not a current user.   Patient scheduled for Friday 1/19 for evaluation due to her work schedule.   ED precautions given for worsening symptoms or SOB.

## 2022-10-13 ENCOUNTER — Ambulatory Visit: Payer: Medicaid Other | Admitting: Student

## 2022-10-13 ENCOUNTER — Encounter: Payer: Self-pay | Admitting: Student

## 2022-10-13 VITALS — BP 118/76 | HR 85 | Ht 63.0 in | Wt 182.4 lb

## 2022-10-13 DIAGNOSIS — G9331 Postviral fatigue syndrome: Secondary | ICD-10-CM

## 2022-10-13 NOTE — Patient Instructions (Addendum)
It was wonderful to meet you today. Thank you for allowing me to be a part of your care. Below is a short summary of what we discussed at your visit today:  Your cough is most likely a postviral symptom and I suspect the blood in your sputum is most likely from irritation of the lining this in your throat.  I recommend use of warm water, honey and lemon.  Please bring all of your medications to every appointment!  If you have any questions or concerns, please do not hesitate to contact us via phone or MyChart message.   Alen Bleacher, MD [facility]

## 2022-10-13 NOTE — Progress Notes (Addendum)
    SUBJECTIVE:   CHIEF COMPLAINT / HPI:   Patient is a 22 year old female presenting today for cough Cough started about 2 weeks ago This morning had blood-tinged sputum and this was her only episode Of note had cold-like symptoms a week ago, symptoms include headache, congestion, cough She does have history of vaping but no alcohol use No recent travel, no prior history of incarceration Denies any fever, chills, chest pain, difficulty breathing or lightheadedness.  PERTINENT  PMH / PSH: Reviewed   OBJECTIVE:   BP 118/76   Pulse 85   Ht 5\' 3"  (1.6 m)   Wt 182 lb 6.4 oz (82.7 kg)   LMP 10/04/2022   SpO2 97%   BMI 32.31 kg/m    Physical Exam General: Alert, well appearing, NAD HEENT: Mild oropharyngeal erythema, no tonsillar exudate or cervical lymphadenopathy Cardiovascular: RRR, No Murmurs, Normal S2/S2 Respiratory: CTAB, No wheezing or Rales Abdomen: No distension or tenderness   ASSESSMENT/PLAN:   Postviral cough Patient's symptoms and exam finding consistent with postviral cough.  Her hemoptysis or blood-tinged sputum is likely due to irritation of the line of the oropharynx due to her cough.   -Provided patient with reassurance -encouraged use of warm water, honey and lemon. -Reviewed return precautions with patient which she verbalized understanding and amenable to plan -Low threshold for CXR if she develops fever, chills or recurrent hemoptysis   Alen Bleacher, MD [place]

## 2022-10-26 ENCOUNTER — Ambulatory Visit (INDEPENDENT_AMBULATORY_CARE_PROVIDER_SITE_OTHER): Payer: Medicaid Other

## 2022-10-26 DIAGNOSIS — Z23 Encounter for immunization: Secondary | ICD-10-CM

## 2022-10-26 NOTE — Progress Notes (Signed)
Patient presents to nurse clinic for flu vaccination. Administered in RD, site unremarkable, tolerated injection well.   Patient is also due for Tdap, however, we are out of stock. Patient will call back next week to schedule for this vaccination.   Provided with updated immunization record.   Talbot Grumbling, RN

## 2022-12-08 ENCOUNTER — Ambulatory Visit (INDEPENDENT_AMBULATORY_CARE_PROVIDER_SITE_OTHER): Payer: Medicaid Other | Admitting: Family Medicine

## 2022-12-08 VITALS — BP 120/62 | HR 74 | Ht 63.0 in | Wt 182.0 lb

## 2022-12-08 DIAGNOSIS — Z3490 Encounter for supervision of normal pregnancy, unspecified, unspecified trimester: Secondary | ICD-10-CM | POA: Diagnosis not present

## 2022-12-08 DIAGNOSIS — N912 Amenorrhea, unspecified: Secondary | ICD-10-CM

## 2022-12-08 LAB — POCT URINE PREGNANCY: Preg Test, Ur: POSITIVE — AB

## 2022-12-08 NOTE — Progress Notes (Signed)
    SUBJECTIVE:   CHIEF COMPLAINT / HPI:   Confirm Pregnancy -LMP 11/04/22 -took home pregnancy test which was positive -this is a planned, desired pregnancy -had been trying for a while -history of PCOS but has regular periods, LMP was certain and a typical period for her -this is first pregnancy -endorses fatigue, slight nausea, breast soreness -no vomiting, no vaginal bleeding, no abdominal pain -not taking prenatal vitamin yet -not on any medications -h/o anxiety and depression, states this was situation/trauma related. Doing well off meds for a few years.  PERTINENT  PMH / PSH: PCOS  OBJECTIVE:   BP 120/62   Pulse 74   Ht 5\' 3"  (1.6 m)   Wt 182 lb (82.6 kg)   LMP 11/04/2022   BMI 32.24 kg/m   Gen: NAD, pleasant, able to participate in exam CV: RRR, normal S1/S2 Resp: Normal effort, lungs CTAB Extremities: no edema or cyanosis Skin: warm and dry, no rashes noted Neuro: alert, no obvious focal deficits Psych: Normal affect and mood   ASSESSMENT/PLAN:   Pregnancy Upreg positive in the office. Congratulated patient. [redacted]w[redacted]d by LMP. Reviewed current meds (none) and appropriate in pregnancy. Advised to start PNV. Obtain initial OB labs today. Return in ~4 weeks for initial prenatal visit.   Alcus Dad, MD [place]

## 2022-12-08 NOTE — Patient Instructions (Addendum)
It was great to see you.  Congratulations on your pregnancy! You should start taking a prenatal vitamin every day.  We are checking some labs and urine studies today.  I will send you a MyChart message with the results or call if abnormal.  Please schedule your initial prenatal visit on your way out today.  Take care, Dr. Rock Nephew  Common Medications Safe in Pregnancy  Acne:      Constipation:  Benzoyl Peroxide     Colace  Clindamycin      Dulcolax Suppository  Topica Erythromycin     Fibercon  Salicylic Acid      Metamucil         Miralax AVOID:        Senakot   Accutane    Cough:  Retin-A       Cough Drops  Tetracycline      Phenergan w/ Codeine if Rx  Minocycline      Robitussin (Plain & DM)  Antibiotics:     Crabs/Lice:  Ceclor       RID  Cephalosporins    AVOID:  E-Mycins      Kwell  Keflex  Macrobid/Macrodantin   Diarrhea:  Penicillin      Kao-Pectate  Zithromax      Imodium AD         PUSH FLUIDS AVOID:       Cipro     Fever:  Tetracycline      Tylenol (Regular or Extra  Minocycline       Strength)  Levaquin      Extra Strength-Do not          Exceed 8 tabs/24 hrs Caffeine:        200mg /day (equiv. To 1 cup of coffee or  approx. 3 12 oz sodas)         Gas: Cold/Hayfever:       Gas-X  Benadryl      Mylicon  Claritin       Phazyme  **Claritin-D        Chlor-Trimeton    Headaches:  Dimetapp      ASA-Free Excedrin  Drixoral-Non-Drowsy     Cold Compress  Mucinex (Guaifenasin)     Tylenol (Regular or Extra  Sudafed/Sudafed-12 Hour     Strength)  **Sudafed PE Pseudoephedrine   Tylenol Cold & Sinus     Vicks Vapor Rub  Zyrtec  **AVOID if Problems With Blood Pressure     Heartburn: Avoid lying down for at least 1 hour after meals  Aciphex      Maalox     Rash:  Milk of Magnesia     Benadryl    Mylanta       1% Hydrocortisone Cream  Pepcid  Pepcid Complete   Sleep Aids:  Prevacid      Ambien   Prilosec       Benadryl  Rolaids       Chamomile  Tea  Tums (Limit 4/day)     Unisom         Tylenol PM         Warm milk-add vanilla or  Hemorrhoids:       Sugar for taste  Anusol/Anusol H.C.  (RX: Analapram 2.5%)  Sugar Substitutes:  Hydrocortisone OTC     Ok in moderation  Preparation H      Tucks        Vaseline lotion applied to tissue with wiping    Herpes:  Throat:  Acyclovir      Oragel  Famvir  Valtrex     Vaccines:         Flu Shot Leg Cramps:       *Gardasil  Benadryl      Hepatitis A         Hepatitis B Nasal Spray:       Pneumovax  Saline Nasal Spray     Polio Booster         Tetanus Nausea:       Tuberculosis test or PPD  Vitamin B6 25 mg TID   AVOID:    Dramamine      *Gardasil  Emetrol       Live Poliovirus  Ginger Root 250 mg QID    MMR (measles, mumps &  High Complex Carbs @ Bedtime    rebella)  Sea Bands-Accupressure    Varicella (Chickenpox)  Unisom 1/2 tab TID     *No known complications           If received before Pain:         Known pregnancy;   Darvocet       Resume series after  Lortab        Delivery  Percocet    Yeast:   Tramadol      Femstat  Tylenol 3      Gyne-lotrimin  Ultram       Monistat  Vicodin           MISC:         All Sunscreens           Hair Coloring/highlights          Insect Repellant's          (Including DEET)         Mystic Tans

## 2022-12-12 LAB — CBC/D/PLT+RPR+RH+ABO+RUBIGG...
Antibody Screen: NEGATIVE
Basophils Absolute: 0 10*3/uL (ref 0.0–0.2)
Basos: 1 %
Bilirubin, UA: NEGATIVE
EOS (ABSOLUTE): 0.1 10*3/uL (ref 0.0–0.4)
Eos: 1 %
Glucose, UA: NEGATIVE
HCV Ab: NONREACTIVE
HIV Screen 4th Generation wRfx: NONREACTIVE
Hematocrit: 41.5 % (ref 34.0–46.6)
Hemoglobin: 13.7 g/dL (ref 11.1–15.9)
Hepatitis B Surface Ag: NEGATIVE
Immature Grans (Abs): 0 10*3/uL (ref 0.0–0.1)
Immature Granulocytes: 0 %
Ketones, UA: NEGATIVE
Leukocytes,UA: NEGATIVE
Lymphocytes Absolute: 1.9 10*3/uL (ref 0.7–3.1)
Lymphs: 26 %
MCH: 27.8 pg (ref 26.6–33.0)
MCHC: 33 g/dL (ref 31.5–35.7)
MCV: 84 fL (ref 79–97)
Monocytes Absolute: 0.5 10*3/uL (ref 0.1–0.9)
Monocytes: 7 %
Neutrophils Absolute: 4.7 10*3/uL (ref 1.4–7.0)
Neutrophils: 65 %
Nitrite, UA: NEGATIVE
Platelets: 253 10*3/uL (ref 150–450)
Protein,UA: NEGATIVE
RBC, UA: NEGATIVE
RBC: 4.93 x10E6/uL (ref 3.77–5.28)
RDW: 12.5 % (ref 11.7–15.4)
RPR Ser Ql: NONREACTIVE
Rh Factor: POSITIVE
Rubella Antibodies, IGG: 3 index (ref >0.99)
Specific Gravity, UA: 1.028 (ref 1.005–1.030)
Urobilinogen, Ur: 1 mg/dL (ref 0.2–1.0)
WBC: 7.2 10*3/uL (ref 3.4–10.8)
pH, UA: 6 (ref 5.0–7.5)

## 2022-12-12 LAB — HGB FRACTIONATION CASCADE
Hgb A2: 2.8 % (ref 1.8–3.2)
Hgb A: 97.2 % (ref 96.4–98.8)
Hgb F: 0 % (ref 0.0–2.0)
Hgb S: 0 %

## 2022-12-12 LAB — MICROSCOPIC EXAMINATION
Bacteria, UA: NONE SEEN
Casts: NONE SEEN /lpf

## 2022-12-12 LAB — HCV INTERPRETATION

## 2022-12-12 LAB — URINE CULTURE, OB REFLEX

## 2022-12-29 ENCOUNTER — Inpatient Hospital Stay (HOSPITAL_COMMUNITY)
Admission: AD | Admit: 2022-12-29 | Discharge: 2022-12-29 | Disposition: A | Discharge status: Home / Self Care | Payer: Medicaid Other | Attending: Family Medicine | Admitting: Family Medicine

## 2022-12-29 ENCOUNTER — Other Ambulatory Visit: Payer: Self-pay

## 2022-12-29 ENCOUNTER — Inpatient Hospital Stay (HOSPITAL_COMMUNITY): Payer: Medicaid Other

## 2022-12-29 ENCOUNTER — Emergency Department (HOSPITAL_COMMUNITY): Admission: EM | Admit: 2022-12-29 | Discharge: 2022-12-29 | Payer: Medicaid Other

## 2022-12-29 ENCOUNTER — Encounter (HOSPITAL_COMMUNITY): Payer: Self-pay | Admitting: Family Medicine

## 2022-12-29 DIAGNOSIS — O99331 Smoking (tobacco) complicating pregnancy, first trimester: Secondary | ICD-10-CM | POA: Diagnosis present

## 2022-12-29 DIAGNOSIS — O039 Complete or unspecified spontaneous abortion without complication: Secondary | ICD-10-CM | POA: Insufficient documentation

## 2022-12-29 DIAGNOSIS — Z3A01 Less than 8 weeks gestation of pregnancy: Secondary | ICD-10-CM

## 2022-12-29 LAB — URINALYSIS, ROUTINE W REFLEX MICROSCOPIC
Bilirubin Urine: NEGATIVE
Glucose, UA: NEGATIVE mg/dL
Ketones, ur: NEGATIVE mg/dL
Leukocytes,Ua: NEGATIVE
Nitrite: NEGATIVE
Protein, ur: NEGATIVE mg/dL
Specific Gravity, Urine: 1.011 (ref 1.005–1.030)
pH: 7 (ref 5.0–8.0)

## 2022-12-29 LAB — CBC
HCT: 39 % (ref 36.0–46.0)
Hemoglobin: 13.4 g/dL (ref 12.0–15.0)
MCH: 28.5 pg (ref 26.0–34.0)
MCHC: 34.4 g/dL (ref 30.0–36.0)
MCV: 83 fL (ref 80.0–100.0)
Platelets: 234 10*3/uL (ref 150–400)
RBC: 4.7 MIL/uL (ref 3.87–5.11)
RDW: 12.6 % (ref 11.5–15.5)
WBC: 6.3 10*3/uL (ref 4.0–10.5)
nRBC: 0 % (ref 0.0–0.2)

## 2022-12-29 LAB — HCG, QUANTITATIVE, PREGNANCY: hCG, Beta Chain, Quant, S: 30 m[IU]/mL — ABNORMAL HIGH (ref <5)

## 2022-12-29 LAB — WET PREP, GENITAL
Clue Cells Wet Prep HPF POC: NONE SEEN
Sperm: NONE SEEN
Trich, Wet Prep: NONE SEEN
WBC, Wet Prep HPF POC: >=10 — AB (ref <10)
Yeast Wet Prep HPF POC: NONE SEEN

## 2022-12-29 NOTE — MAU Provider Note (Signed)
History     [id number]  Arrival date and time: 12/29/22 1302    Chief Complaint  Patient presents with   Vaginal Bleeding     HPI Molly Mendez is a 22 y.o. at [redacted]w[redacted]d by LMP who presents for vaginal bleeding. Was seen at [facility] in [place] last week. Had HCG of 670 & nothing on ultrasound. Was supposed to f/u with and OB this week but hadn't found an ob to go to yet.  Reports daily bleeding x 4 days after her ED visit. Yesterday had brown spotting. Today has no pain or bleeding. Denies any other symptoms.    Review of records from Care Everywhere: Seen in [facility] on 3/30. RH positive. HCG 670. Negative ultrasound.   A/Positive/-- (03/15 1658)  OB History     Gravida  1   Para      Term      Preterm      AB      Living         SAB      IAB      Ectopic      Multiple      Live Births              Past Medical History:  Diagnosis Date   ADHD (attention deficit hyperactivity disorder)    Anxiety    Depression    UTI (urinary tract infection) 02/25/2020    Past Surgical History:  Procedure Laterality Date   EYE MUSCLE SURGERY Right x2   as a child   MEDIAN RECTUS REPAIR Right 05/06/2014   Procedure: MEDIAN RECTUS RESECTION;  Surgeon: Corinda Gubler, MD;  Location: [facility];  Service: Ophthalmology;  Laterality: Right;    Family History  Problem Relation Age of Onset   Anxiety disorder Mother    High Cholesterol Mother    High blood pressure Mother    Depression Father    Anxiety disorder Father    Diabetes Father    Anxiety disorder Sister    Depression Sister    Anxiety disorder Brother    Depression Brother    High blood pressure Maternal Grandmother    High blood pressure Maternal Grandfather    High blood pressure Paternal Grandmother    High blood pressure Paternal Grandfather     Social History   Socioeconomic History   Marital status: Single    Spouse name: Not on file   Number of children: Not on  file   Years of education: Not on file   Highest education level: Not on file  Occupational History   Not on file  Tobacco Use   Smoking status: Former    Types: E-cigarettes    Passive exposure: Never   Smokeless tobacco: Never  Substance and Sexual Activity   Alcohol use: No   Drug use: Not on file   Sexual activity: Not on file  Other Topics Concern   Not on file  Social History Narrative   Not on file   Social Determinants of Health   Financial Resource Strain: Not on file  Food Insecurity: Not on file  Transportation Needs: Not on file  Physical Activity: Not on file  Stress: Not on file  Social Connections: Not on file  Intimate Partner Violence: Not on file    Allergies  Allergen Reactions   Ibuprofen Other (See Comments)    Burning feeling in stomach    No current facility-administered medications on file prior  to encounter.   No current outpatient medications on file prior to encounter.     ROS Pertinent positives and negative per HPI, all others reviewed and negative  Physical Exam   BP 128/75 (BP Location: Right Arm)   Pulse 87   Temp 98.3 F (36.8 C) (Oral)   Resp 18   Ht 5\' 3"  (1.6 m)   Wt 84 kg   LMP 11/04/2022   SpO2 100%   BMI 32.79 kg/m   Patient Vitals for the past 24 hrs:  BP Temp Temp src Pulse Resp SpO2 Height Weight  12/29/22 1354 128/75 98.3 F (36.8 C) Oral 87 18 100 % -- --  12/29/22 1350 -- -- -- -- -- -- 5\' 3"  (1.6 m) 84 kg    Physical Exam Vitals and nursing note reviewed.  Constitutional:      General: She is not in acute distress.    Appearance: She is well-developed. She is not ill-appearing.  HENT:     Head: Normocephalic and atraumatic.  Eyes:     General: No scleral icterus.       Right eye: No discharge.        Left eye: No discharge.     Conjunctiva/sclera: Conjunctivae normal.  Pulmonary:     Effort: Pulmonary effort is normal. No respiratory distress.  Neurological:     General: No focal deficit  present.     Mental Status: She is alert.  Psychiatric:        Mood and Affect: Mood normal.        Behavior: Behavior normal.       Labs Results for orders placed or performed during the hospital encounter of 12/29/22 (from the past 24 hour(s))  CBC     Status: None   Collection Time: 12/29/22  1:52 PM  Result Value Ref Range   WBC 6.3 4.0 - 10.5 K/uL   RBC 4.70 3.87 - 5.11 MIL/uL   Hemoglobin 13.4 12.0 - 15.0 g/dL   HCT 40.939.0 81.136.0 - 91.446.0 %   MCV 83.0 80.0 - 100.0 fL   MCH 28.5 26.0 - 34.0 pg   MCHC 34.4 30.0 - 36.0 g/dL   RDW 78.212.6 95.611.5 - 21.315.5 %   Platelets 234 150 - 400 K/uL   nRBC 0.0 0.0 - 0.2 %  hCG, quantitative, pregnancy     Status: Abnormal   Collection Time: 12/29/22  1:52 PM  Result Value Ref Range   hCG, Beta Chain, Quant, S 30 (H) <5 mIU/mL  Urinalysis, Routine w reflex microscopic -Urine, Clean Catch     Status: Abnormal   Collection Time: 12/29/22  1:57 PM  Result Value Ref Range   Color, Urine YELLOW YELLOW   APPearance CLEAR CLEAR   Specific Gravity, Urine 1.011 1.005 - 1.030   pH 7.0 5.0 - 8.0   Glucose, UA NEGATIVE NEGATIVE mg/dL   Hgb urine dipstick SMALL (A) NEGATIVE   Bilirubin Urine NEGATIVE NEGATIVE   Ketones, ur NEGATIVE NEGATIVE mg/dL   Protein, ur NEGATIVE NEGATIVE mg/dL   Nitrite NEGATIVE NEGATIVE   Leukocytes,Ua NEGATIVE NEGATIVE   RBC / HPF 0-5 0 - 5 RBC/hpf   WBC, UA 0-5 0 - 5 WBC/hpf   Bacteria, UA RARE (A) NONE SEEN   Squamous Epithelial / HPF 0-5 0 - 5 /HPF   Mucus PRESENT    Hyaline Casts, UA PRESENT   Wet prep, genital     Status: Abnormal   Collection Time: 12/29/22  2:03 PM  Specimen: Urine, Clean Catch  Result Value Ref Range   Yeast Wet Prep HPF POC NONE SEEN NONE SEEN   Trich, Wet Prep NONE SEEN NONE SEEN   Clue Cells Wet Prep HPF POC NONE SEEN NONE SEEN   WBC, Wet Prep HPF POC >=10 (A) <10   Sperm NONE SEEN     Imaging US OB LESS THAN 14 WEEKS WITH OB TRANSVAGINAL  Result Date: 12/29/2022 CLINICAL DATA:  Vaginal  bleeding in 1st trimester pregnancy. EXAM: OBSTETRIC <14 WK US AND TRANSVAGINAL OB US TECHNIQUE: Both transabdominal and transvaginal ultrasound examinations were performed for complete evaluation of the gestation as well as the maternal uterus, adnexal regions, and pelvic cul-de-sac. Transvaginal technique was performed to assess early pregnancy. COMPARISON:  None Available. FINDINGS: Intrauterine gestational sac: None Maternal uterus/adnexae: Endometrial thickness measures 5 mm. Both ovaries are normal in appearance. No mass or abnormal free fluid identified. IMPRESSION: Pregnancy of unknown anatomic location (no intrauterine gestational sac or adnexal mass identified). Differential diagnosis includes recent spontaneous abortion, IUP too early to visualize, and non-visualized ectopic pregnancy. Recommend followup of beta-hCG levels, and follow up US as warranted clinically. Electronically Signed   By: Danae OrleansJohn A Stahl M.D.   On: 12/29/2022 15:23    MAU Course  Procedures  Lab Orders         Wet prep, genital         CBC         hCG, quantitative, pregnancy         Urinalysis, Routine w reflex microscopic -Urine, Clean Catch    No orders of the defined types were placed in this encounter.   Imaging Orders         US OB LESS THAN 14 WEEKS WITH OB TRANSVAGINAL     MDM Low  Assessment and Plan   1. Miscarriage   2. [redacted] weeks gestation of pregnancy    -Patient had bleeding this week that resolved yesterday. Currently is asymptomatic. She is RH positive.  Ultrasound today shows no IUP or adnexal mass. HCG today is 30 which is a significant drop from her HCG last week of 670 and is consistent with miscarriage.  -Reviewed results with patient. She is stable. Her PCP is [facility] and she will follow up with them - she is unsure if she wants to be seen for miscarriage follow up.  -Discussed pelvic rest & reasons to return to MAU.     Dispo: discharged to home in stable condition.   Discharge  Instructions     Discharge patient   Complete by: As directed    Discharge disposition: 01-Home or Self Care   Discharge patient date: 12/29/2022       Judeth HornErin Karena Kinker, NP 12/29/22 3:53 PM  Allergies as of 12/29/2022       Reactions   Ibuprofen Other (See Comments)   Burning feeling in stomach        Medication List    You have not been prescribed any medications.

## 2022-12-29 NOTE — MAU Note (Signed)
Molly Mendez is a 22 y.o. at [redacted]w[redacted]d here in MAU reporting: she began having VB last Saturday, seen in [facility] and told everything was fine.  Reports has continues to spot but not having and VB/spotting today. LMP: NA Onset of complaint: Satiurday Pain score: 0 Vitals:   12/29/22 1354  BP: 128/75  Pulse: 87  Resp: 18  Temp: 98.3 F (36.8 C)  SpO2: 100%     FHT:NA Lab orders placed from triage:   UA

## 2022-12-29 NOTE — Discharge Instructions (Signed)
Return to care  If you have heavier bleeding that soaks through more than 2 pads per hour for an hour or more If you bleed so much that you feel like you might pass out or you do pass out If you have significant abdominal pain that is not improved with Tylenol   

## 2023-01-01 LAB — GC/CHLAMYDIA PROBE AMP (~~LOC~~) NOT AT ARMC
Chlamydia: NEGATIVE
Comment: NEGATIVE
Comment: NORMAL
Neisseria Gonorrhea: NEGATIVE

## 2023-01-09 ENCOUNTER — Encounter: Payer: Medicaid Other | Admitting: Student

## 2023-01-10 ENCOUNTER — Encounter: Payer: Medicaid Other | Admitting: Family Medicine

## 2023-02-06 ENCOUNTER — Telehealth: Payer: Medicaid Other | Admitting: Family Medicine

## 2023-02-06 DIAGNOSIS — R3989 Other symptoms and signs involving the genitourinary system: Secondary | ICD-10-CM

## 2023-02-06 MED ORDER — NITROFURANTOIN MONOHYD MACRO 100 MG PO CAPS
100.0000 mg | ORAL_CAPSULE | Freq: Two times a day (BID) | ORAL | 0 refills | Status: AC
Start: 2023-02-06 — End: 2023-02-11

## 2023-02-06 NOTE — Progress Notes (Signed)
Virtual Visit Consent   Molly Mendez, you are scheduled for a virtual visit with a [facility] provider today. Just as with appointments in the office, your consent must be obtained to participate. Your consent will be active for this visit and any virtual visit you may have with one of our providers in the next 365 days. If you have a MyChart account, a copy of this consent can be sent to you electronically.  As this is a virtual visit, video technology does not allow for your provider to perform a traditional examination. This may limit your provider's ability to fully assess your condition. If your provider identifies any concerns that need to be evaluated in person or the need to arrange testing (such as labs, EKG, etc.), we will make arrangements to do so. Although advances in technology are sophisticated, we cannot ensure that it will always work on either your end or our end. If the connection with a video visit is poor, the visit may have to be switched to a telephone visit. With either a video or telephone visit, we are not always able to ensure that we have a secure connection.  By engaging in this virtual visit, you consent to the provision of healthcare and authorize for your insurance to be billed (if applicable) for the services provided during this visit. Depending on your insurance coverage, you may receive a charge related to this service.  I need to obtain your verbal consent now. Are you willing to proceed with your visit today? Molly Mendez has provided verbal consent on 02/06/2023 for a virtual visit (video or telephone). Freddy Finner, NP  Date: 02/06/2023 12:28 PM  Virtual Visit via Video Note   I, Freddy Finner, connected with  Molly Mendez  ([id number], 02-29-2001) on 02/06/23 at 12:30 PM EDT by a video-enabled telemedicine application and verified that I am speaking with the correct person using two identifiers.  Location: Patient:  Virtual Visit Location Patient: Home Provider: Virtual Visit Location Provider: Home Office   I discussed the limitations of evaluation and management by telemedicine and the availability of in person appointments. The patient expressed understanding and agreed to proceed.    History of Present Illness: Molly Mendez is a 22 y.o. who identifies as a female who was assigned female at birth, and is being seen today for UTI.   HPI: Urinary Tract Infection  This is a new problem. The current episode started in the past 7 days (2-3 days ago). The problem occurs every urination. The problem has been gradually worsening. The quality of the pain is described as burning. The patient is experiencing no pain. There has been no fever. She is Sexually active. There is No history of pyelonephritis. Associated symptoms include frequency, hesitancy and urgency. Pertinent negatives include no chills, discharge, flank pain, hematuria, nausea, possible pregnancy, sweats or vomiting. She has tried increased fluids for the symptoms. The treatment provided no relief.    Problems:  Patient Active Problem List   Diagnosis Date Noted   Pregnancy 12/08/2022   Health maintenance examination 04/20/2022   Right foot pain 01/26/2021   Depression, major, single episode, mild (HCC) 08/25/2020   Shortness of breath 04/29/2020   Vaginal irritation 03/08/2020   GERD (gastroesophageal reflux disease) 03/08/2020   UTI (urinary tract infection) 02/25/2020   Hair loss 07/28/2019   Anxiety and depression 07/13/2019   History of PCOS 07/13/2019   Exotropia, intermittent, monocular 05/05/2014  Allergies:  Allergies  Allergen Reactions   Ibuprofen Other (See Comments)    Burning feeling in stomach   Medications: No current outpatient medications on file.  Observations/Objective: Patient is well-developed, well-nourished in no acute distress.  Resting comfortably  at home.  Head is normocephalic,  atraumatic.  No labored breathing.  Speech is clear and coherent with logical content.  Patient is alert and oriented at baseline.    Assessment and Plan:   1. Suspected UTI  - nitrofurantoin, macrocrystal-monohydrate, (MACROBID) 100 MG capsule; Take 1 capsule (100 mg total) by mouth 2 (two) times daily for 5 days.  Dispense: 10 capsule; Refill: 0  -miscarriage last month- notes that she is not 13 weeks as the chart reports- note in EPIC -no other red flags for stone or kidney infection -increase fluids -complete medication as discussed -prevention discussed and on AVS  Reviewed side effects, risks and benefits of medication.    Patient acknowledged agreement and understanding of the plan.   Past Medical, Surgical, Social History, Allergies, and Medications have been Reviewed.   Follow Up Instructions: I discussed the assessment and treatment plan with the patient. The patient was provided an opportunity to ask questions and all were answered. The patient agreed with the plan and demonstrated an understanding of the instructions.  A copy of instructions were sent to the patient via MyChart unless otherwise noted below.    The patient was advised to call back or seek an in-person evaluation if the symptoms worsen or if the condition fails to improve as anticipated.  Time:  I spent 6 minutes with the patient via telehealth technology discussing the above problems/concerns.    Freddy Finner, NP

## 2023-02-06 NOTE — Patient Instructions (Signed)
Molly Mendez, thank you for joining Freddy Finner, NP for today's virtual visit.  While this provider is not your primary care provider (PCP), if your PCP is located in our provider database this encounter information will be shared with them immediately following your visit.   A [facility] MyChart account gives you access to today's visit and all your visits, tests, and labs performed at [facility] " click here if you don't have a [facility] MyChart account or go to [email or web address]  Consent: (Patient) Molly Mendez provided verbal consent for this virtual visit at the beginning of the encounter.  Current Medications:  Current Outpatient Medications:    nitrofurantoin, macrocrystal-monohydrate, (MACROBID) 100 MG capsule, Take 1 capsule (100 mg total) by mouth 2 (two) times daily for 5 days., Disp: 10 capsule, Rfl: 0   Medications ordered in this encounter:  Meds ordered this encounter  Medications   nitrofurantoin, macrocrystal-monohydrate, (MACROBID) 100 MG capsule    Sig: Take 1 capsule (100 mg total) by mouth 2 (two) times daily for 5 days.    Dispense:  10 capsule    Refill:  0    Order Specific Question:   Supervising Provider    Answer:   Merrilee Jansky [id number]     *If you need refills on other medications prior to your next appointment, please contact your pharmacy*  Follow-Up: Call back or seek an in-person evaluation if the symptoms worsen or if the condition fails to improve as anticipated.  [facility] [phone]  Other Instructions Urinary Tract Infection, Adult  A urinary tract infection (UTI) is an infection of any part of the urinary tract. The urinary tract includes the kidneys, ureters, bladder, and urethra. These organs make, store, and get rid of urine in the body. An upper UTI affects the ureters and kidneys. A lower UTI affects the bladder and urethra. What are the causes? Most urinary  tract infections are caused by bacteria in your genital area around your urethra, where urine leaves your body. These bacteria grow and cause inflammation of your urinary tract. What increases the risk? You are more likely to develop this condition if: You have a urinary catheter that stays in place. You are not able to control when you urinate or have a bowel movement (incontinence). You are female and you: Use a spermicide or diaphragm for birth control. Have low estrogen levels. Are pregnant. You have certain genes that increase your risk. You are sexually active. You take antibiotic medicines. You have a condition that causes your flow of urine to slow down, such as: An enlarged prostate, if you are female. Blockage in your urethra. A kidney stone. A nerve condition that affects your bladder control (neurogenic bladder). Not getting enough to drink, or not urinating often. You have certain medical conditions, such as: Diabetes. A weak disease-fighting system (immunesystem). Sickle cell disease. Gout. Spinal cord injury. What are the signs or symptoms? Symptoms of this condition include: Needing to urinate right away (urgency). Frequent urination. This may include small amounts of urine each time you urinate. Pain or burning with urination. Blood in the urine. Urine that smells bad or unusual. Trouble urinating. Cloudy urine. Vaginal discharge, if you are female. Pain in the abdomen or the lower back. You may also have: Vomiting or a decreased appetite. Confusion. Irritability or tiredness. A fever or chills. Diarrhea. The first symptom in older adults may be confusion. In some cases, they may not have  any symptoms until the infection has worsened. How is this diagnosed? This condition is diagnosed based on your medical history and a physical exam. You may also have other tests, including: Urine tests. Blood tests. Tests for STIs (sexually transmitted infections). If  you have had more than one UTI, a cystoscopy or imaging studies may be done to determine the cause of the infections. How is this treated? Treatment for this condition includes: Antibiotic medicine. Over-the-counter medicines to treat discomfort. Drinking enough water to stay hydrated. If you have frequent infections or have other conditions such as a kidney stone, you may need to see a health care provider who specializes in the urinary tract (urologist). In rare cases, urinary tract infections can cause sepsis. Sepsis is a life-threatening condition that occurs when the body responds to an infection. Sepsis is treated in the hospital with IV antibiotics, fluids, and other medicines. Follow these instructions at home:  Medicines Take over-the-counter and prescription medicines only as told by your health care provider. If you were prescribed an antibiotic medicine, take it as told by your health care provider. Do not stop using the antibiotic even if you start to feel better. General instructions Make sure you: Empty your bladder often and completely. Do not hold urine for long periods of time. Empty your bladder after sex. Wipe from front to back after urinating or having a bowel movement if you are female. Use each tissue only one time when you wipe. Drink enough fluid to keep your urine pale yellow. Keep all follow-up visits. This is important. Contact a health care provider if: Your symptoms do not get better after 1-2 days. Your symptoms go away and then return. Get help right away if: You have severe pain in your back or your lower abdomen. You have a fever or chills. You have nausea or vomiting. Summary A urinary tract infection (UTI) is an infection of any part of the urinary tract, which includes the kidneys, ureters, bladder, and urethra. Most urinary tract infections are caused by bacteria in your genital area. Treatment for this condition often includes antibiotic  medicines. If you were prescribed an antibiotic medicine, take it as told by your health care provider. Do not stop using the antibiotic even if you start to feel better. Keep all follow-up visits. This is important. This information is not intended to replace advice given to you by your health care provider. Make sure you discuss any questions you have with your health care provider. Document Revised: 04/23/2020 Document Reviewed: 04/23/2020 Elsevier Patient Education  2023 Elsevier Inc.    If you have been instructed to have an in-person evaluation today at a local Urgent Care facility, please use the link below. It will take you to a list of all of our available [facility], including address, phone number and hours of operation. Please do not delay care.  [facility]  If you or a family member do not have a primary care provider, use the link below to schedule a visit and establish care. When you choose a [facility] primary care physician or advanced practice provider, you gain a long-term partner in health. Find a Primary Care Provider  Learn more about [facility]'s in-office and virtual care options: [facility] - Get Care Now

## 2023-06-06 ENCOUNTER — Other Ambulatory Visit (HOSPITAL_COMMUNITY)
Admission: RE | Admit: 2023-06-06 | Discharge: 2023-06-06 | Disposition: A | Discharge status: Home / Self Care | Payer: Commercial Managed Care - PPO | Source: Ambulatory Visit | Attending: Family Medicine | Admitting: Family Medicine

## 2023-06-06 ENCOUNTER — Encounter: Payer: Self-pay | Admitting: Family Medicine

## 2023-06-06 ENCOUNTER — Other Ambulatory Visit: Payer: Self-pay

## 2023-06-06 ENCOUNTER — Ambulatory Visit (INDEPENDENT_AMBULATORY_CARE_PROVIDER_SITE_OTHER): Payer: Commercial Managed Care - PPO | Admitting: Family Medicine

## 2023-06-06 VITALS — BP 116/83 | HR 83 | Ht 62.0 in | Wt 182.8 lb

## 2023-06-06 DIAGNOSIS — Z113 Encounter for screening for infections with a predominantly sexual mode of transmission: Secondary | ICD-10-CM

## 2023-06-06 DIAGNOSIS — N3000 Acute cystitis without hematuria: Secondary | ICD-10-CM | POA: Diagnosis not present

## 2023-06-06 DIAGNOSIS — Z3200 Encounter for pregnancy test, result unknown: Secondary | ICD-10-CM | POA: Diagnosis not present

## 2023-06-06 DIAGNOSIS — Z23 Encounter for immunization: Secondary | ICD-10-CM | POA: Diagnosis not present

## 2023-06-06 DIAGNOSIS — R3989 Other symptoms and signs involving the genitourinary system: Secondary | ICD-10-CM | POA: Diagnosis not present

## 2023-06-06 LAB — POCT WET PREP (WET MOUNT)
Clue Cells Wet Prep Whiff POC: NEGATIVE
Trichomonas Wet Prep HPF POC: ABSENT
WBC, Wet Prep HPF POC: >20

## 2023-06-06 LAB — POCT URINE PREGNANCY: Preg Test, Ur: NEGATIVE

## 2023-06-06 MED ORDER — CEFADROXIL 500 MG PO CAPS
500.0000 mg | ORAL_CAPSULE | Freq: Two times a day (BID) | ORAL | 0 refills | Status: AC
Start: 2023-06-06 — End: 2023-06-11

## 2023-06-06 NOTE — Assessment & Plan Note (Signed)
Pt trying to conceive  LMP 05/16/23 Upreg today Advised daily prenatal vitamin with folate Discussed contraception options to consider in the future

## 2023-06-06 NOTE — Assessment & Plan Note (Deleted)
Pt trying to conceive  LMP 05/16/23 Upreg today Advised daily prenatal vitamin with folate Discussed contraception options to consider in the future

## 2023-06-06 NOTE — Assessment & Plan Note (Signed)
Given patient's symptoms (dysuria, urinary frequency), similarity to previous UTIs, and age, will empirically treat with Cefadroxil 500mg  BID for 5 days.

## 2023-06-06 NOTE — Patient Instructions (Addendum)
Thank you for coming in today!  Things we discussed today: Please take a prenatal vitamin with at least of folate daily as long as you are trying for pregnancy 2. I have prescribed an antibiotic for your UTI. Please take this as instructed for the full course. 3. I will send you a MyChart message or call with your test results in the next few days.  Please follow up with [place] as needed.  Have a great day!  Things to do to Keep yourself Healthy - Exercise at least 30-45 minutes a day, 3-4 days a week. >150 min of moderate intensity per week is advised. - Eat a low-fat diet with lots of fruits and vegetables, up to 7-9 servings per day. - Seatbelts can save your life. Wear them always. - Smoke detectors on every level of your home, check batteries every year. - Eye Doctor - have an eye exam every 1-2 years - Safe sex - if you may be exposed to STDs, use a condom. - Alcohol If you drink, do it moderately, less than 1 drink per day. - Health Care Power of Attorney.  Choose someone to speak for you if you are not able. - Depression is common in our stressful world.If you're feeling down or losing interest in things you normally enjoy, please come in for a visit. - Violence - If anyone is threatening or hurting you, please call immediately.

## 2023-06-06 NOTE — Progress Notes (Signed)
    SUBJECTIVE:   Chief compliant/HPI: annual examination  Molly Mendez is a 22 y.o. who presents today for an annual exam.   Review of systems form notable for urinary frequency and burning.   Dysuria -urinary frequency and burning after urinating for about 1 week -Has had UTIs in the past, current symptoms are similar -No contraception currently, trying to conceive  -no fevers -no recent ABX use -no vaginal discharge -some mild abdominal pain -no new sexual partners -would like to be tested for STIs today as well  Prenatal counseling -trying for pregnancy now -had a miscarriage in April 2024 -LMP 05/16/23 -not taking prenatal vitamin currently   Updated history tabs and problem list .   OBJECTIVE:   BP 116/83   Pulse 83   Ht 5\' 2"  (1.575 m)   Wt 182 lb 12.8 oz (82.9 kg)   LMP 05/16/2023 (Approximate)   SpO2 99%   Breastfeeding No   BMI 33.43 kg/m    Physical exam Gen: young woman sitting in exam room in NAD, pleasant and cooperative with exam CV: RRR, normal S1/S2, no murmurs, rubs or gallops Pulm: CTAB, normal WOB, no wheezes, rales, or rhonchi Abd: bowel sounds normoactive, mild tenderness at epigastric region, soft, nondistended GU: (chaperone present, Ottley, CMA) no vaginal lesions or atrophy, scant white discharge at cervical os, nontender cervix without lesions  ASSESSMENT/PLAN:   UTI (urinary tract infection) Given patient's symptoms (dysuria, urinary frequency), similarity to previous UTIs, and age, will empirically treat with Cefadroxil 500mg  BID for 5 days.  Possible pregnancy, not confirmed Pt trying to conceive  LMP 05/16/23 Upreg today Advised daily prenatal vitamin with folate Discussed contraception options to consider in the future    Annual Examination  See AVS for age appropriate recommendations.   PHQ score 1, reviewed and discussed. Blood pressure reviewed and at goal .  Asked about intimate partner violence and  patient reports none.  The patient currently uses nothing for contraception. Discussed prenatal vitamin with folate recommended as appropriate, minimum of 400 mcg per day.  Advanced directives not done   Considered the following items based upon USPSTF recommendations: HIV testing: ordered Hepatitis C: discussed, done recently, no risk factors Hepatitis B: discussed, done recently, no risk factors Syphilis if at high risk: ordered GC/CT 24 or  younger, ordered. Lipid panel (nonfasting or fasting) discussed based upon AHA recommendations and not ordered.  Consider repeat every 4-6 years.  Reviewed risk factors for latent tuberculosis and not indicated  Discussed family history, BRCA testing not indicated. Tool used to risk stratify was Pedigree Assessment tool. 2 aunts had breast cancer- early-late 50s, no first degree relatives with breast cancer. Cervical cancer screening: prior Pap reviewed, repeat due in 2026 Immunizations : flu shot today   Follow up in 1  year or sooner if indicated.    Lorayne Bender, MD [facility]

## 2023-06-08 LAB — CERVICOVAGINAL ANCILLARY ONLY
Bacterial Vaginitis (gardnerella): NEGATIVE
Chlamydia: NEGATIVE
Comment: NEGATIVE
Comment: NEGATIVE
Comment: NEGATIVE
Comment: NORMAL
Neisseria Gonorrhea: NEGATIVE
Trichomonas: NEGATIVE

## 2023-06-15 ENCOUNTER — Other Ambulatory Visit: Payer: Self-pay | Admitting: Family Medicine

## 2023-06-15 DIAGNOSIS — Z113 Encounter for screening for infections with a predominantly sexual mode of transmission: Secondary | ICD-10-CM

## 2023-08-02 ENCOUNTER — Encounter: Payer: Self-pay | Admitting: Family Medicine

## 2023-08-02 ENCOUNTER — Ambulatory Visit (INDEPENDENT_AMBULATORY_CARE_PROVIDER_SITE_OTHER): Payer: Commercial Managed Care - PPO | Admitting: Family Medicine

## 2023-08-02 VITALS — BP 124/68 | HR 86 | Ht 62.0 in | Wt 186.8 lb

## 2023-08-02 DIAGNOSIS — Z3200 Encounter for pregnancy test, result unknown: Secondary | ICD-10-CM

## 2023-08-02 DIAGNOSIS — Z3A01 Less than 8 weeks gestation of pregnancy: Secondary | ICD-10-CM

## 2023-08-02 DIAGNOSIS — Z3491 Encounter for supervision of normal pregnancy, unspecified, first trimester: Secondary | ICD-10-CM | POA: Diagnosis not present

## 2023-08-02 LAB — POCT URINE PREGNANCY: Preg Test, Ur: POSITIVE — AB

## 2023-08-02 MED ORDER — DOXYLAMINE-PYRIDOXINE 10-10 MG PO TBEC
DELAYED_RELEASE_TABLET | ORAL | 3 refills | Status: DC
Start: 2023-08-02 — End: 2024-02-07

## 2023-08-02 NOTE — Progress Notes (Signed)
    SUBJECTIVE:   CHIEF COMPLAINT / HPI:   Positive pregnancy test at home, would like to confirm G2P0, early spontaneous miscarriage in April 2024 Patient has been trying to conceive Has been taking Prenatal vitamin Wants prenatal care here 9/14 LMP - [redacted]w[redacted]d, EDD 03/15/24 Up-to-date on flu shot  Reports nausea and vomiting in the mornings, would like something for the symptoms Occasional abdominal cramping that last for few minutes and goes away, patient not concerned about it.  States with her last miscarriage the cramping was very constant and very painful  PERTINENT  PMH / PSH: GERD, PCOS, anxiety/depression  OBJECTIVE:   BP 124/68   Pulse 86   Ht 5\' 2"  (1.575 m)   Wt 186 lb 12.8 oz (84.7 kg)   LMP 06/09/2023   SpO2 100%   BMI 34.17 kg/m   Gen: well appearing, no acute distress  ASSESSMENT/PLAN:   Pregnancy Pregnancy test positive today, last menstrual period June 09, 2023.  Had miscarriage in April 2024, cycles were irregular for 3 months after that and then regulated. -Transvaginal ultrasound to confirm location and dating -Continue prenatal vitamins -Diclegis for nausea vomiting -Return in 4 weeks for initial prenatal appointment -Initial OB labs obtained today   Para March, DO [facility]

## 2023-08-02 NOTE — Assessment & Plan Note (Signed)
Pregnancy test positive today, last menstrual period June 09, 2023.  Had miscarriage in April 2024, cycles were irregular for 3 months after that and then regulated. -Transvaginal ultrasound to confirm location and dating -Continue prenatal vitamins -Diclegis for nausea vomiting -Return in 4 weeks for initial prenatal appointment -Initial OB labs obtained today

## 2023-08-02 NOTE — Patient Instructions (Signed)
It was wonderful to see you today. Congratulations on your pregnancy!  I have sent Diclegis just to your pharmacy to help with nausea and vomiting, take as directed  We are scheduling an appointment for you to get an ultrasound to confirm the dating and location of your pregnancy  If you have any cramping that does not go away or worsens please go to the MAU, I have listed a map at the bottom of these papers with directions  Prenatal Vitamins: Recommend a daily prenatal vitamin containing at least 400 mcg of folic acid to reduce the risk of neural tube defects and 30 mg of elemental iron to support fetal development.  Diet and Nutrition: Encourage a well-balanced diet including omega-3 fatty acids, and advise against unpasteurized foods due to the risk of listeriosis. Limit caffeine intake to 200 mg per day and avoid artificial sweeteners.  Exercise regularly  Do not use alcohol, nicotine, or recreational drugs such as marijuana   Thank you for choosing [facility].    If you had blood work today, I will send you a MyChart message or a letter if results are normal. Otherwise, I will give you a call.   If you had a referral placed, they will call you to set up an appointment. Please give [place] a call if you don't hear back in the next 2 weeks.   Best, Dr. [facility]     Please make a follow up appointment in 4 weeks.  Prenatal Classes Go to [email or web address] for more information on the pregnancy and child birth classes that [place] has to offer.   Pregnancy Related Return Precautions The follow are signs/symptoms that are abnormal in pregnancy and may require further evaluation by a physician: Go to the MAU at [facility] at [facility] if: You have cramping/contractions that do not go away with drinking water, especially if they are lasting 30 seconds to 1.5 minutes, coming and going every 5-10  minutes for an hour or more, or are getting stronger and you cannot walk or talk while having a contraction/cramp. Your water breaks.  Sometimes it is a big gush of fluid, sometimes it is just a trickle that keeps getting your underwear wet or running down your legs You have vaginal bleeding.    You do not feel your baby moving like normal.  If you do not, get something to eat and drink (something cold or something with sugar like peanut butter or juice) and lay down and focus on feeling your baby move. If your baby is still not moving like normal, you should go to MAU. You should feel your baby move 6 times in one hour, or 10 times in two hours. You have a persistent headache that does not go away with 1 g of Tylenol, vision changes, chest pain, difficulty breathing, severe pain in your right upper abdomen, worsening leg swelling- these can all be signs of high blood pressure in pregnancy and need to be evaluated by a provider immediately  These are all concerning in pregnancy and if you have any of these I recommend you call your PCP and present to the Maternity Admissions Unit (map below) for further evaluation.  For any pregnancy-related emergencies, please go to the Maternity Admissions Unit in the [facility] at [facility]. You will use hospital Entrance C.    Our clinic number is [phone].   Dr Miquel Dunn

## 2023-08-03 ENCOUNTER — Encounter: Payer: Self-pay | Admitting: Family Medicine

## 2023-08-03 ENCOUNTER — Ambulatory Visit (HOSPITAL_COMMUNITY)
Admission: RE | Admit: 2023-08-03 | Discharge: 2023-08-03 | Disposition: A | Discharge status: Home / Self Care | Payer: Commercial Managed Care - PPO | Source: Ambulatory Visit | Attending: Family Medicine | Admitting: Family Medicine

## 2023-08-03 DIAGNOSIS — Z3687 Encounter for antenatal screening for uncertain dates: Secondary | ICD-10-CM | POA: Insufficient documentation

## 2023-08-03 DIAGNOSIS — N926 Irregular menstruation, unspecified: Secondary | ICD-10-CM | POA: Insufficient documentation

## 2023-08-03 DIAGNOSIS — Z3A01 Less than 8 weeks gestation of pregnancy: Secondary | ICD-10-CM | POA: Insufficient documentation

## 2023-08-06 ENCOUNTER — Telehealth: Payer: Self-pay

## 2023-08-06 LAB — CBC/D/PLT+RPR+RH+ABO+RUBIGG...
Antibody Screen: NEGATIVE
Basophils Absolute: 0 10*3/uL (ref 0.0–0.2)
Basos: 0 %
Bilirubin, UA: NEGATIVE
EOS (ABSOLUTE): 0.1 10*3/uL (ref 0.0–0.4)
Eos: 1 %
Glucose, UA: NEGATIVE
HCV Ab: NONREACTIVE
HIV Screen 4th Generation wRfx: NONREACTIVE
Hematocrit: 40.6 % (ref 34.0–46.6)
Hemoglobin: 13.4 g/dL (ref 11.1–15.9)
Hepatitis B Surface Ag: NEGATIVE
Immature Grans (Abs): 0.1 10*3/uL (ref 0.0–0.1)
Immature Granulocytes: 1 %
Ketones, UA: NEGATIVE
Leukocytes,UA: NEGATIVE
Lymphocytes Absolute: 2 10*3/uL (ref 0.7–3.1)
Lymphs: 18 %
MCH: 28.3 pg (ref 26.6–33.0)
MCHC: 33 g/dL (ref 31.5–35.7)
MCV: 86 fL (ref 79–97)
Monocytes Absolute: 0.9 10*3/uL (ref 0.1–0.9)
Monocytes: 8 %
Neutrophils Absolute: 7.8 10*3/uL — ABNORMAL HIGH (ref 1.4–7.0)
Neutrophils: 72 %
Nitrite, UA: NEGATIVE
Platelets: 275 10*3/uL (ref 150–450)
Protein,UA: NEGATIVE
RBC, UA: NEGATIVE
RBC: 4.74 x10E6/uL (ref 3.77–5.28)
RDW: 12.4 % (ref 11.7–15.4)
RPR Ser Ql: NONREACTIVE
Rh Factor: POSITIVE
Rubella Antibodies, IGG: 3.34 {index} (ref >0.99)
Specific Gravity, UA: 1.014 (ref 1.005–1.030)
Urobilinogen, Ur: 0.2 mg/dL (ref 0.2–1.0)
WBC: 10.9 10*3/uL — ABNORMAL HIGH (ref 3.4–10.8)
pH, UA: 5.5 (ref 5.0–7.5)

## 2023-08-06 LAB — HCV INTERPRETATION

## 2023-08-06 LAB — MICROSCOPIC EXAMINATION
Bacteria, UA: NONE SEEN
Casts: NONE SEEN /LPF
RBC, Urine: NONE SEEN /HPF (ref 0–2)
WBC, UA: NONE SEEN /[HPF] (ref 0–5)

## 2023-08-06 LAB — HGB FRACTIONATION CASCADE
Hgb A2: 2.8 % (ref 1.8–3.2)
Hgb A: 97.2 % (ref 96.4–98.8)
Hgb F: 0 % (ref 0.0–2.0)
Hgb S: 0 %

## 2023-08-06 LAB — URINE CULTURE, OB REFLEX

## 2023-08-06 NOTE — Telephone Encounter (Signed)
Patient had US done on 11/8.

## 2023-08-06 NOTE — Telephone Encounter (Signed)
-----   Message from [facility] Jasmine December S sent at 08/06/2023  1:24 PM EST ----- Ok to schedule Molly Mendez at [facility],  Melvenia Beam

## 2023-08-09 ENCOUNTER — Other Ambulatory Visit (HOSPITAL_COMMUNITY)
Admission: RE | Admit: 2023-08-09 | Discharge: 2023-08-09 | Disposition: A | Discharge status: Home / Self Care | Payer: Commercial Managed Care - PPO | Source: Ambulatory Visit | Attending: Family Medicine | Admitting: Family Medicine

## 2023-08-09 ENCOUNTER — Encounter: Payer: Self-pay | Admitting: Student

## 2023-08-09 ENCOUNTER — Ambulatory Visit (INDEPENDENT_AMBULATORY_CARE_PROVIDER_SITE_OTHER): Payer: Commercial Managed Care - PPO | Admitting: Student

## 2023-08-09 VITALS — BP 115/65 | HR 79 | Wt 189.0 lb

## 2023-08-09 DIAGNOSIS — N898 Other specified noninflammatory disorders of vagina: Secondary | ICD-10-CM | POA: Diagnosis not present

## 2023-08-09 DIAGNOSIS — Z3A01 Less than 8 weeks gestation of pregnancy: Secondary | ICD-10-CM | POA: Diagnosis not present

## 2023-08-09 LAB — POCT WET PREP (WET MOUNT)
Clue Cells Wet Prep Whiff POC: NEGATIVE
Trichomonas Wet Prep HPF POC: ABSENT
WBC, Wet Prep HPF POC: >20

## 2023-08-09 LAB — POCT 1 HR PRENATAL GLUCOSE: Glucose 1 Hr Prenatal, POC: 159 mg/dL

## 2023-08-09 NOTE — Progress Notes (Signed)
Patient Name: Molly Mendez Date of Birth: 2000-10-18 [facility] Initial Prenatal Visit  Molly Mendez is a 22 y.o. year old G2P0 who presents for her initial prenatal visit.   G2P0 - had miscarriage April 2024, trying to conceive and taking prenatal. U/S was ordered for 08/03/23 and obtained.   Was given rx for Diclegis last visit.   Pregnancy is planned.  She reports vaginal discharge with a brown-ish tint to it and this is a change from previous.  She is taking a prenatal vitamin.  She denies pelvic pain or vaginal bleeding.   Blood pressure 137/72, pulse 79, weight 189 lb (85.7 kg), last menstrual period 06/09/2023.  Pregnancy Dating: The patient is dated by ultrasound  LMP: 06/09/23 -EDD 03/15/24  U/S: on 08/03/23 -EDD 03/26/2024 (This was performed prior to 9w and is >5 days discrepancy from LMP)  Period is certain:  Yes.  Periods were regular:  No.  Using hormonal contraception in 3 months prior to conception: No but had miscarriage as noted above causing irregular cycle   Lab Review: Blood type: A Rh Status: + Antibody screen: Negative HIV: Negative RPR: Negative Hemoglobin electrophoresis reviewed: Yes Results of OB urine culture are: Negative - 10K cfu mixed flora Rubella: Immune Hep C Ab: Negative Varicella status is Unknown  PMH: Reviewed and as detailed below: HTN: No  Gestational Hypertension/preeclampsia: No  Type 1 or 2 Diabetes: No  Depression:  Yes  Seizure disorder:  No VTE: No ,  History of STI No,  Abnormal Pap smear:  No, NILM 04/18/22 Genital herpes simplex:  No   PSH: Gynecologic Surgery:  no Surgical history reviewed, notable for: N/A   Obstetric History: Obstetric history tab updated and reviewed.  Summary of prior pregnancies: Miscarriage April 2024.  Cesarean delivery: No  Gestational Diabetes:  No Hypertension in pregnancy: No History of preterm birth: No History of LGA/SGA infant:   No History of shoulder dystocia: No Indications for referral were reviewed, and the patient has no obstetric indications for referral to High Risk OB Clinic at this time.   Social History: Partner's name: Molly Mendez   Tobacco use: No Alcohol use:  No Other substance use:  No  Current Medications:  Diclegis  Prenatal  Reviewed and appropriate in pregnancy.   Genetic and Infection Screen: Flow Sheet Updated Yes  Prenatal Exam: Gen: Well nourished, well developed.  No distress.  Vitals noted. HEENT: Normocephalic, atraumatic.  Neck supple without cervical lymphadenopathy, thyromegaly or thyroid nodules.  Fair dentition. CV: RRR no murmur, gallops or rubs Lungs: CTA B.  Normal respiratory effort without wheezes or rales. Abd: soft, NTND. +BS.  Uterus not appreciated above pelvis. GU: Normal external female genitalia without lesions.  Nl vaginal, well rugated without lesions. Scant white, yellowish vaginal discharge  Ext: No clubbing, cyanosis or edema. Psych: Normal grooming and dress.  Not depressed or anxious appearing.  Normal thought content and process without flight of ideas or looseness of associations  Fetal heart tones: Not Assessed   Assessment/Plan:  Molly Mendez is a 22 y.o. G1P0 at [redacted]w[redacted]d who presents to initiate prenatal care. She is doing well.  Current pregnancy issues include vaginal discharge.  Routine prenatal care: Dating tab updated.  Pre-pregnancy weight updated. Expected weight gain this pregnancy is 11-20 pounds  Prenatal labs reviewed. Indications for referral to HROB were reviewed and the patient does not meet criteria for referral.  Medication list reviewed and updated.  Recommended patient  see a dentist for regular care.  Bleeding and pain precautions reviewed. Importance of prenatal vitamins reviewed.  Genetic screening offered. Patient opted for: first trimester screen with nuchal translucency at 11-13 weeks and may do quad screen. Placed  future order for Panorama to be assessed at next visit  The patient has the following indications for aspirinto begin 81 mg at 12-16 weeks: One high risk condition: no single high risk condition  MORE than one moderate risk condition: nulliparity and obesity Aspirin was  recommended today based upon above risk factors (one high risk condition or more than one moderate risk factor)  The patient will not be age 23 or over at time of delivery. Referral to genetic counseling was not offered today.  The patient has the following risk factors for preexisting diabetes: BMI > 25 and high risk ethnicity (Latino, [place] American, Native American, Mal[place] Islander, Asian Naval architect) . An early 1 hour glucose tolerance test was ordered. Pregnancy Medical Home and PHQ-9 forms completed, problems noted: Yes  2. Pregnancy issues include the following which were addressed today:  Vaginal Discharge: Vaginal exam performed and wet prep/GC/Chlamydia swab obtained  Meets criteria for bbASA at 12 weeks- discussed at visit    Follow up 4 weeks for next prenatal visit.

## 2023-08-09 NOTE — Progress Notes (Deleted)
Patient Name: Molly Mendez Date of Birth: 01/29/2001 [facility] Initial Prenatal Visit  Molly Mendez Molly Mendez is a 22 y.o. year old G2P0 who presents for her initial prenatal visit.   G2P0 - had miscarriage April 2024, trying to conceive and taking prenatal. U/S was ordered for 08/03/23 but unable to see any results.   Was given rx for Diclegis last visit.   Pregnancy is planned.  She reports Vaginal discharge with a brown-ish tint to it  She is taking a prenatal vitamin.  She denies pelvic pain or vaginal bleeding.   Pregnancy Dating: The patient is dated by ultrasound  LMP: 06/09/23 -EDD 03/15/24  U/S: on 08/03/23 -EDD 03/26/2024 (This was performed prior to 9w and is >5 days discrepancy from LMP)  Period is certain:  Yes.  Periods were regular:  No.  Using hormonal contraception in 3 months prior to conception: No but had miscarriage as noted above causing irregular cycle   Lab Review: Blood type: A Rh Status: + Antibody screen: Negative HIV: Negative RPR: Negative Hemoglobin electrophoresis reviewed: Yes Results of OB urine culture are: Negative - 10K cfu mixed flora Rubella: Immune Hep C Ab: Negative Varicella status is Unknown  PMH: Reviewed and as detailed below: HTN: No  Gestational Hypertension/preeclampsia: No  Type 1 or 2 Diabetes: No  Depression:  Yes  Seizure disorder:  No VTE: No ,  History of STI No,  Abnormal Pap smear:  No, NILM 04/18/22 Genital herpes simplex:  No   PSH: Gynecologic Surgery:  no Surgical history reviewed, notable for: N/A    Obstetric History: Obstetric history tab updated and reviewed.  Summary of prior pregnancies: Miscarriage April 2024.  Cesarean delivery: No  Gestational Diabetes:  No Hypertension in pregnancy: No History of preterm birth: No History of LGA/SGA infant:  No History of shoulder dystocia: No Indications for referral were reviewed, and the patient has no obstetric  indications for referral to High Risk OB Clinic at this time.   Social History: Partner's name: Celene Skeen   Tobacco use: No Alcohol use:  No Other substance use:  No  Current Medications:  Diclegis  Prenatal  Reviewed and appropriate in pregnancy.   Genetic and Infection Screen: Flow Sheet Updated Yes  Prenatal Exam: Gen: Well nourished, well developed.  No distress.  Vitals noted. HEENT: Normocephalic, atraumatic.  Neck supple without cervical lymphadenopathy, thyromegaly or thyroid nodules.  Fair dentition. CV: RRR no murmur, gallops or rubs Lungs: CTA B.  Normal respiratory effort without wheezes or rales. Abd: soft, NTND. +BS.  Uterus not appreciated above pelvis. GU: Normal external female genitalia without lesions.  Nl vaginal, well rugated without lesions. No vaginal discharge.  Bimanual exam: No adnexal mass or TTP. No CMT.  Uterus size *** Ext: No clubbing, cyanosis or edema. Psych: Normal grooming and dress.  Not depressed or anxious appearing.  Normal thought content and process without flight of ideas or looseness of associations  Fetal heart tones: Not Assessed   Assessment/Plan:  Molly Mendez is a 22 y.o. G1P0 at [redacted]w[redacted]d who presents to initiate prenatal care. She is doing well.  Current pregnancy issues include ***.  Routine prenatal care: Dating tab updated.  Pre-pregnancy weight updated. Expected weight gain this pregnancy is 11-20 pounds  Prenatal labs reviewed, notable for . Indications for referral to HROB were reviewed and the patient {DOES NOT does:27190::"does not"} meet criteria for referral.  Medication list reviewed and updated.  Recommended patient see a  dentist for regular care.  Bleeding and pain precautions reviewed. Importance of prenatal vitamins reviewed.  Genetic screening offered. Patient opted for: {obgeneticscreen:23414}. The patient has the following indications for aspirinto begin 81 mg at 12-16 weeks: One high risk condition:  no single high risk condition  MORE than one moderate risk condition: nulliparity and obesity Aspirin was  recommended today based upon above risk factors (one high risk condition or more than one moderate risk factor)  The patient will not be age 63 or over at time of delivery. Referral to genetic counseling was not offered today.  The patient has the following risk factors for preexisting diabetes: BMI > 25 and high risk ethnicity (Latino, [place] American, Native American, Mal[place] Islander, Asian Naval architect) . An early 1 hour glucose tolerance test {WAS/WAS [phone]::"was not"} ordered. Pregnancy Medical Home and PHQ-9 forms completed, problems noted: {yes/no:20286}  2. Pregnancy issues include the following which were addressed today:  ***   Follow up 4 weeks for next prenatal visit.

## 2023-08-09 NOTE — Patient Instructions (Addendum)
It was great to see you today! Thank you for choosing [facility] for your obstetric care. Molly Mendez was seen for initial OB visit.   -I will update you with the results of the swabs and lab  -Panorama screen would by the name of the test that we would order for next visit  -You will need to start aspirin at 12 weeks  -Currently, you are [redacted]w[redacted]d   MOMS TO BE  Activity: Moms-to-be must be very diligent about getting enough exercise, as staying active will not only help you keep your weight gains to a healthy level, but can also help you manage your symptoms, and strengthen your body for labor.  Nutrition: Even though prenatal vitamins contain lots of vitamins and nutrients you need, it's important to reinforce this nutritional need through healthy eating. Pregnant women should do their best to eat plenty of fresh fruits, veggies, and meats, and avoid eating overly fatty or processed foods, as the body works best with only natural fuels.  Sleep: Getting the right amount of ZZZs is imperative during pregnancy, as it allows your body to recover to the strength it needs to help baby develop healthily, and can help you manage your toughest symptoms.  Blood pressure: Abnormal blood pressure, whether high or low, can both be dangerous for you and Baby during pregnancy, so it's very important to monitor your BP readings so you'll know that all is well.   Genetic Screening Options For you  NIPT stands for noninvasive prenatal testing. It's a screening test offered during pregnancy to see if the fetus is at risk for having a chromosomal disorder like Down syndrome (trisomy 76), trisomy 74 (Edwards syndrome) and trisomy 67 (Patau syndrome). The test can also determine the sex of the fetus.   Go to the MAU at [facility] if: You have pain in your lower abdomen or pelvic area Your water breaks.  Sometimes it is a big gush of fluid, sometimes it is just a  trickle that keeps getting your underwear wet or running down your legs You have vaginal bleeding.  If you haven't already, sign up for My Chart to have easy access to your labs results, and communication with your primary care physician.  We are checking some labs today. If they are abnormal, I will call you. If they are normal, I will send you a MyChart message (if it is active) or a letter in the mail. If you do not hear about your labs in the next 2 weeks, please call the office.   You should return to our clinic as scheduled   I recommend that you always bring your medications to each appointment as this makes it easy to ensure you are on the correct medications and helps [place] not miss refills when you need them.  Please arrive 15 minutes before your appointment to ensure smooth check in process.  We appreciate your efforts in making this happen.  Please call the clinic at [phone] if your symptoms worsen or you have any concerns.  Thank you for allowing me to participate in your care, Angeleigh Chiasson [employer]

## 2023-08-13 LAB — CERVICOVAGINAL ANCILLARY ONLY
Chlamydia: NEGATIVE
Comment: NEGATIVE
Comment: NEGATIVE
Comment: NORMAL
Neisseria Gonorrhea: NEGATIVE
Trichomonas: NEGATIVE

## 2023-08-16 ENCOUNTER — Other Ambulatory Visit (INDEPENDENT_AMBULATORY_CARE_PROVIDER_SITE_OTHER): Payer: Commercial Managed Care - PPO

## 2023-08-16 DIAGNOSIS — Z3A01 Less than 8 weeks gestation of pregnancy: Secondary | ICD-10-CM

## 2023-08-16 DIAGNOSIS — Z3491 Encounter for supervision of normal pregnancy, unspecified, first trimester: Secondary | ICD-10-CM | POA: Diagnosis not present

## 2023-08-16 LAB — POCT CBG (FASTING - GLUCOSE)-MANUAL ENTRY: Glucose Fasting, POC: 93 mg/dL (ref 70–99)

## 2023-08-17 ENCOUNTER — Other Ambulatory Visit: Payer: Self-pay | Admitting: Family Medicine

## 2023-08-17 ENCOUNTER — Telehealth: Payer: Self-pay

## 2023-08-17 ENCOUNTER — Other Ambulatory Visit (HOSPITAL_COMMUNITY): Payer: Self-pay

## 2023-08-17 ENCOUNTER — Encounter: Payer: Self-pay | Admitting: Family Medicine

## 2023-08-17 LAB — GESTATIONAL GLUCOSE TOLERANCE
Glucose, Fasting: 81 mg/dL (ref 70–94)
Glucose, GTT - 1 Hour: 100 mg/dL (ref 70–179)
Glucose, GTT - 2 Hour: 103 mg/dL (ref 70–154)
Glucose, GTT - 3 Hour: 70 mg/dL (ref 70–139)

## 2023-08-17 NOTE — Telephone Encounter (Signed)
Pharmacy Patient Advocate Encounter   Received notification from CoverMyMeds that prior authorization for Doxylamine-Pyridoxine is required/requested.   Insurance verification completed.   The patient is insured through [facility] .   Per test claim:  OTC DOXYLAMINE SUCCINATE 25MG  TABLETS, AND OTC PYRIDOXINE (VITAMIN B6) 25MG  TABLET (SEPARATELY). is preferred by the insurance.  If suggested medication is appropriate, Please send in a new RX and discontinue this one. If not, please advise as to why it's not appropriate so that we may request a Prior Authorization.

## 2023-08-20 ENCOUNTER — Encounter: Payer: Self-pay | Admitting: Student

## 2023-09-07 ENCOUNTER — Ambulatory Visit: Payer: Self-pay | Admitting: Student

## 2023-09-07 NOTE — Progress Notes (Unsigned)
  Patient Name: Molly Mendez Date of Birth: 01/12/2001 [facility] Prenatal Visit  MLYNN WEINKAUF [name] is a 22 y.o. G2P0 at Unknown here for routine follow up. She is dated by {Ob dating:14516}.  She reports {symptoms; pregnancy related:14538}.  She denies vaginal bleeding.  See flow sheet for details.  There were no vitals filed for this visit.  U/S: on 08/03/23 -EDD 03/26/2024 (This was performed prior to 9w and is >5 days discrepancy from LMP)   A/P: Pregnancy at Unknown.  Doing well.    Routine Prenatal Care:  Dating reviewed, dating tab is {correct:23336::"correct"} Fetal heart tones {appropriate:23337} Influenza vaccine {given:23340}  COVID vaccination was discussed and ***.  screening preexisting diabetes done and passed  Anatomy ultrasound ordered to be scheduled at 18-20 weeks. Patient {is/is not:9024} interested in genetic screening. As she is past 13 weeks and 6 days, a {quad:23339::"Quad screen "} was offered.  Pregnancy education including expected weight gain in pregnancy, OTC medication use, continued use of prenatal vitamin, smoking cessation if applicable, and nutrition in pregnancy.   Bleeding and pain precautions reviewed. The patient has the following indications for aspirinto begin 81 mg at 12-16 weeks: One high risk condition: no single high risk condition  MORE than one moderate risk condition: nulliparity and obesity Aspirin was  recommended today based upon above risk factors (one high risk condition or more than one moderate risk factor)   2. Pregnancy issues include the following and were addressed as appropriate today:  *** Problem list  and pregnancy box updated: {yes/no:20286::"Yes"}.   Follow up 4 weeks.

## 2023-09-21 ENCOUNTER — Ambulatory Visit: Payer: Medicaid Other | Admitting: Student

## 2023-09-21 NOTE — Progress Notes (Deleted)
  Patient Name: Molly Mendez Date of Birth: 18-Apr-2001 [facility] Prenatal Visit  Molly Mendez is a 22 y.o. G2P0 at Unknown here for routine follow up. She is dated by {Ob dating:14516}.  She reports {symptoms; pregnancy related:14538}.  She denies vaginal bleeding.  See flow sheet for details.  There were no vitals filed for this visit.   A/P: Pregnancy at Unknown.  Doing well.    Routine Prenatal Care:  Dating reviewed, dating tab is {correct:23336::"correct"} Fetal heart tones {appropriate:23337} Influenza vaccine {given:23340}  COVID vaccination was discussed and ***.  Patient previously screened for pre-existing diabetes, 3-hour GTT normal Anatomy ultrasound ordered to be scheduled at 18-20 weeks. *** Patient {is/is not:9024} interested in genetic screening. As she is past 13 weeks and 6 days, a {quad:23339::"Quad screen "} was offered.  Pregnancy education including expected weight gain in pregnancy, OTC medication use, continued use of prenatal vitamin, smoking cessation if applicable, and nutrition in pregnancy.   Bleeding and pain precautions reviewed. The patient has the following indications for aspirinto begin 81 mg at 12-16 weeks: One high risk condition: no single high risk condition  MORE than one moderate risk condition: obesity and low SES   Aspirin was  recommended today based upon above risk factors (one high risk condition or more than one moderate risk factor)   2. Pregnancy issues include the following and were addressed as appropriate today:  *** Problem list  and pregnancy box updated: {yes/no:20286::"Yes"}.   Follow up 4 weeks.

## 2023-09-24 ENCOUNTER — Ambulatory Visit (INDEPENDENT_AMBULATORY_CARE_PROVIDER_SITE_OTHER): Payer: Medicaid Other | Admitting: Student

## 2023-09-24 ENCOUNTER — Encounter: Payer: Self-pay | Admitting: Student

## 2023-09-24 ENCOUNTER — Ambulatory Visit: Payer: Medicaid Other | Admitting: Student

## 2023-09-24 VITALS — BP 127/78 | HR 78 | Temp 98.1°F | Ht 62.0 in | Wt 189.4 lb

## 2023-09-24 DIAGNOSIS — Z3490 Encounter for supervision of normal pregnancy, unspecified, unspecified trimester: Secondary | ICD-10-CM

## 2023-09-24 DIAGNOSIS — Z3A01 Less than 8 weeks gestation of pregnancy: Secondary | ICD-10-CM

## 2023-09-24 NOTE — Progress Notes (Unsigned)
  Patient Name: Molly Mendez Date of Birth: 19-May-2001 [facility] Prenatal Visit  DAROLYN BERTHIAUME [name] is a 22 y.o. G2P0 at Unknown here for routine follow up. She is dated by LMP.  She reports headache and cramping, light pink bledding during sex .  See flow sheet for details.  Vitals:   09/24/23 1530  BP: 127/78  Pulse: 78  Temp: 98.1 F (36.7 C)     A/P: Pregnancy at Unknown.  Doing well.    Routine Prenatal Care:  Dating reviewed, dating tab is correct Fetal heart tones Appropriate Influenza vaccine previously administered.   COVID vaccination was discussed and she declined.  The patient has the following indication for screening preexisting diabetes: BMI > 25 and sedentary lifestyle. Anatomy ultrasound ordered to be scheduled at 18-20 weeks. Patient is interested in genetic screening. As she is past 13 weeks and 6 days, a Quad screen  was offered.  Pregnancy education including expected weight gain in pregnancy, OTC medication use, continued use of prenatal vitamin, smoking cessation if applicable, and nutrition in pregnancy.   Bleeding and pain precautions reviewed. The patient has the following indications for aspirinto begin 81 mg at 12-16 weeks: One high risk condition: no single high risk condition  MORE than one moderate risk condition: obesity and low SES   Aspirin was  recommended today based upon above risk factors (one high risk condition or more than one moderate risk factor)   2. Pregnancy issues include the following and were addressed as appropriate today:  Headache: Feels like migraine, tyl 500 helps, appreciates pain in front of head that spreads to back and photosensitivity.  Tylenol 1000 mg every 8 hours Nose Bleeds/congestion: Happening multiple times a week, using humidifier, happens when she blows her nose a lot, appreciates rhinorrhea Fluticasone Vaginal Bleeding w/ Intercourse Recommend refrain from intercourse for  1 month, then trial again. If bleeding persist, will want to refrain during pregnancy.  Problem list  and pregnancy box updated: {yes/no:20286::"Yes"}.   Follow up 4 weeks.

## 2023-09-24 NOTE — Patient Instructions (Signed)
It was great to see you today! Thank you for choosing [facility] for your obstetric care. Molly Mendez was seen for OB visit.   Baby is doing well, great job!! We will need an anatomy ultrasound of baby in the coming weeks, but we will schedule this. Baby Asprin: Sometimes mother's need aspirin in pregnancy for various reasons to keep baby safe. I recommend you take 81 mg of aspirin daily during pregnancy  Headaches: These sound like migraines, you can take 1000 mg of tylenol every 8 hours as needed. Nose Bleeds/congestion: You can try some fluticasone (Flonase daily) to see if this decreases your congestion/runny nose Vaginal Bleeding: This is dangerous and could cause you to loose the pregnancy. Avoid intercourse for a month or so, then try again. If bleeding continues, you will want to refrain from intercourse.  Genetic testing: Testing today!  Commonly Asked Questions During Pregnancy  How Will I Feel When I'm Pregnant? Pregnancy symptoms in the first trimester of pregnancy may not appear until the middle or end of the second month. Hormonal changes will cause tenderness in your breasts, and you may begin to feel more tired than usual. Food cravings, an increase in the need to urinate, and morning sickness may all be more noticeable.  Pregnancy symptoms in the second trimester are more prominent. You may start to feel the baby move and become more active. Dental issues, nasal/sinus problems, and skin irritations can begin to appear. Heartburn, leg cramps, dizziness, and a vaginal discharge are also common. Every woman is different when it comes to the symptoms they experience, and some may not experience any at all. Pregnancy symptoms in the third trimester can include increased frequency in urination, leg cramps, constipation, ligament pain in the abdomen, and weight gain. Back pain and Braxton Hicks contractions will become increasingly more common.  Why is nutrition  during pregnancy important? Eating well is one of the best things you can do during pregnancy. Good nutrition helps you handle the extra demands on your body as your pregnancy progresses. The goal is to balance getting enough nutrients to support the growth of your fetus and maintaining a healthy weight.  How much water should I drink during pregnancy? During pregnancy you should drink 8 to 12 cups (64 to 96 ounces) of water every day. Water has many benefits. It aids digestion and helps form the amniotic fluid around the fetus. Water also helps nutrients circulate in the body and helps waste leave the body.  What can I do to help with nausea? Eat dry toast or crackers in the morning before you get out of bed to avoid moving around on an empty stomach. Eat five or six "mini meals" a day to ensure that your stomach is never empty. Eat frequent bites of foods like nuts, fruits, or crackers.  What can help with constipation during pregnancy? Constipation is common near the end of pregnancy. Eating more foods with fiber can help fight constipation. Fiber is found in fruits, vegetables, whole grains, beans, nuts, and seeds. You should aim for about 25 grams of fiber in your diet each day. Drink a lot of water as you increase your fiber intake.  How much coffee can I drink while I'm pregnant? Research suggests that moderate caffeine consumption (less than 200 milligrams per day) does not cause miscarriage or preterm birth. That's the amount in one 12-ounce cup of coffee. Remember that caffeine also is found in tea, chocolate, energy drinks, and soft  drinks. Caffeine can interfere with sleep and contribute to nausea and light-headedness. Caffeine also can increase urination and lead to dehydration.  What can I do to prevent or ease back pain during pregnancy? There are several things you can do to prevent or ease back pain. For example, wear supportive clothing and shoes. Pay attention to your position  when sitting, sleeping, and lifting things. If you need to stand for a long time, rest one foot on a stool or a box to take the strain off your back. You also can use heat or cold to soothe sore muscles.  Is it safe to exercise during pregnancy? If you are healthy and your pregnancy is normal, it is safe to continue or start regular physical activity. Physical activity does not increase your risk of miscarriage, low birth weight, or early delivery. It's still important to discuss exercise with your ob-gyn provider during your early prenatal visits.   What are the benefits of exercise during pregnancy? Regular exercise during pregnancy benefits you and your fetus in these key ways: Reduces back pain Eases constipation May decrease your risk of gestational diabetes, preeclampsia, and cesarean birth Promotes healthy weight gain during pregnancy Improves your overall fitness and strengthens your heart and blood vessels Helps you to lose the baby weight after your baby is born  Is it safe to dye my hair during pregnancy? Yes, it's safe. Only a small amount of chemicals from hair dye is absorbed through the scalp.  Is it safe to keep a cat during pregnancy? Yes, you can keep your cat. You may have heard that cat feces can carry the infection toxoplasmosis. This infection is only found in cats who go outdoors and hunt prey, such as mice and other rodents. If you do have a cat who goes outdoors or eats prey, have someone else take over daily cleaning the litter box. This will keep you away from any cat feces. If you have an indoor cat who only eats cat food and doesn't have contact with outside animals, your risk of toxoplasmosis is very low.  What substances should I avoid during pregnancy? During pregnancy, women should not use tobacco, alcohol, marijuana, illegal drugs, or prescription medications for nonmedical reasons. Avoiding these substances and getting regular prenatal care are important to  having a healthy pregnancy and a healthy baby.   What foods do I need to avoid in pregnancy? To help prevent listeriosis, avoid eating the following foods while you are pregnant: Unpasteurized milk and foods made with unpasteurized milk, including soft cheeses Hot dogs and luncheon meats, unless they are heated until steaming hot just before serving Unwashed raw produce such as fruits and vegetables  Avoid all raw and undercooked seafood, eggs, meat, and poultry while you are pregnant. Do not eat sushi made with raw fish (cooked sushi is safe). Cooking and pasteurization are the only ways to kill Listeria.  Limit your exposure to mercury by not eating bigeye tuna, king mackerel, marlin, orange roughy, shark, swordfish, or tilefish. Limit eating white (albacore) tuna to 6 ounces a week. You do not have to avoid all fish during pregnancy. In fact, fish and shellfish are nutritious foods with vital nutrients for a pregnant woman and her fetus. Be sure to eat at least 8-12 ounces of low-mercury fish and shellfish per week.  Is travel safe to do during pregnancy? In most cases, pregnant women can travel safely until close to their due dates. But travel may not be recommended for women  who have pregnancy complications. If you are planning a trip, talk with your (ob-gyn) provider. And no matter how you choose to travel, think ahead about your comfort and safety.  Can I use a sauna or hot tub early in pregnancy? It's best not to. Your core body temperature rises when you use saunas and hot tubs. This rise in temperature can be harmful for your fetus.  Can I get a massage while pregnant? Yes. Massage is a good way to relax and improve circulation. The best position for a massage while you're pregnant is lying on your side, rather than facedown. Some massage tables have a cut-out for the belly, allowing you to lie facedown comfortably. Tell your massage therapist that you're pregnant if you're not showing  yet. Many health spas offer special prenatal massages done by therapists who are trained to work on pregnant women.  Is Having Dental Work While Pregnant Safe? Pregnancy and dental work questions are common for expecting moms. Preventive dental cleanings and annual exams during pregnancy are not only safe but are recommended. The rise in hormone levels during pregnancy causes the gums to swell, bleed, and trap food causing increased irritation to your gums. Preventive dental work while pregnant is essential to avoid oral infections such as gum disease, which has been linked to preterm birth. The American Dental Association (ADA) recommends pregnant women eat a balanced diet, brush their teeth thoroughly with ADA-approved fluoride toothpaste twice a day, and floss daily. Have preventive exams and cleanings during your pregnancy. Let your dentist know you are pregnant. Postpone non-emergency dental work until the second trimester or after delivery, if possible. Elective procedures should be postponed until after the delivery.  If you haven't already, sign up for My Chart to have easy access to your labs results, and communication with your primary care physician.  We are checking some labs today. If they are abnormal, I will call you. If they are normal, I will send you a MyChart message (if it is active) or a letter in the mail. If you do not hear about your labs in the next 2 weeks, please call the office.   You should return to our clinic No follow-ups on file.  I recommend that you always bring your medications to each appointment as this makes it easy to ensure you are on the correct medications and helps [place] not miss refills when you need them.   Please arrive 15 minutes before your appointment to ensure smooth check in process.  We appreciate your efforts in making this happen.  Please call the clinic at [phone] if your symptoms worsen or you have any concerns.  Thank you for  allowing me to participate in your care, Bess Kinds, MD 09/24/2023, 3:46 PM PGY-3, [facility]

## 2023-09-24 NOTE — Progress Notes (Signed)
No Charge

## 2023-09-24 NOTE — Patient Instructions (Signed)
No charge.

## 2023-09-26 NOTE — L&D Delivery Note (Addendum)
 OB/GYN Faculty Practice Delivery Note  Molly Mendez is a 23 y.o. G2P0010 s/p VAVD at [redacted]w[redacted]d. She was admitted for induction of labor for cholestasis of pregnancy.   ROM: 9h 46m with clear fluid GBS Status: Positive/-- (06/05 1620) - treated with PCN Maximum Maternal Temperature: 31F   Labor Progress: Initial SVE: closed/thick. She then progressed to complete. Her induction of labor included Cytotec , foley balloon, AROM, and Pitocin .  Delivery Date/Time: 03/06/24 0228  Infant was delivered via Vacuum Assisted Vaginal Delivery due to fetal bradycardia.  The patient was examined and found to be Presentation: vertex; Position: Direct, Occiput,, Anterior; Station: +2 and pelvis felt adequate with EFW 3000gm with bladder just emptied.   Called to bedside to check pt's cervix. Patient complete and at +2 station. She had fetal bradycardia into the 70s, sustaining x 4 minutes. Discussed recommendation to proceed with vacuum assisted delivery with patient, who provided verbal consent. Delivery call made, Dr. Aquilla Knapp at bedside. At this time, FHR back in the 150s.   Verbal consent: obtained from patient.  Risks and benefits discussed in detail.  Risks include, but are not limited to the risks of anesthesia, bleeding, infection, damage to maternal tissues, fetal cephalhematoma.  There is also the risk of inability to effect vaginal delivery of the head, or shoulder dystocia that cannot be resolved by established maneuvers, leading to the need for emergency cesarean section.  The Kiwi vacuum was placed and used per [employer] instruction. The infant was then delivered atraumatically.  On next contraction, patient had VAVD of infant, no pop offs.   The infant had poor tone, so cord clamped x 2 immediately, cut by father of baby. The infant was then handed off to awaiting Peds team. Cord blood and cord gases collected. Pitocin  started. Fundus firm with massage and Pitocin . 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   Sponge, instrument and needle counts were correct x2.  Baby Weight: 2910g Placenta: Sent to L&D Complications: None Lacerations: Bilateral vaginal lacerations, repaired EBL: 200 mL Analgesia: Epidural   Infant:  APGAR (1 MIN): 4  APGAR (5 MINS): 8  Cord Gases Arterial pH 7.18//CO2 60//A-B deficit 6.8 //Bicarb 22.4 Venous pH 7.23//CO2 54//A-B deficit 5.5//Bicarb 22.6  Molly Spires, MD OB Fellow, Faculty Practice [facility], Center for [facility]     Agree with above. I was present for the entire procedure.   Tyler Gallant MD Attending Center for [employer] [facility])

## 2023-09-27 ENCOUNTER — Other Ambulatory Visit: Payer: Medicaid Other

## 2023-10-03 LAB — PANORAMA PRENATAL TEST FULL PANEL:PANORAMA TEST PLUS 5 ADDITIONAL MICRODELETIONS: FETAL FRACTION: 8.5

## 2023-10-04 ENCOUNTER — Telehealth: Payer: Self-pay

## 2023-10-04 NOTE — Telephone Encounter (Signed)
 Received call from the lab in regards to orders received.   Molly Mendez reports they received a blood sample for Molly Mendez, however the only order received was Molly Mendez.  The Panorama test has resulted already.   I spoke with Molly Mendez. He reports with those kits all tubes are sent regardless of test ordered. He reports they will run the test order received.   Will forward to provider who saw patient to see if Molly Mendez was requested or if Panorama was the intended only test.   I do not see where Molly Mendez was ordered only Panorama.

## 2023-10-05 NOTE — Telephone Encounter (Signed)
 Received returned call from [place] at [facility] regarding this concern.   She states that this is their final attempt to resolve matter.   Requesting phone call at [phone] to resolve this issue.   Veronda Prude, RN

## 2023-10-05 NOTE — Telephone Encounter (Signed)
 Called and resolved.  Thank you for your help!  -BS

## 2023-10-17 NOTE — Progress Notes (Signed)
 error

## 2023-11-12 ENCOUNTER — Encounter: Payer: Medicaid Other | Admitting: Student

## 2023-11-12 ENCOUNTER — Telehealth: Payer: Self-pay

## 2023-11-12 ENCOUNTER — Ambulatory Visit (INDEPENDENT_AMBULATORY_CARE_PROVIDER_SITE_OTHER): Payer: Medicaid Other | Admitting: Student

## 2023-11-12 VITALS — BP 110/68 | HR 90 | Wt 197.2 lb

## 2023-11-12 DIAGNOSIS — R519 Headache, unspecified: Secondary | ICD-10-CM | POA: Diagnosis not present

## 2023-11-12 DIAGNOSIS — J310 Chronic rhinitis: Secondary | ICD-10-CM | POA: Diagnosis not present

## 2023-11-12 DIAGNOSIS — Z3A20 20 weeks gestation of pregnancy: Secondary | ICD-10-CM | POA: Diagnosis not present

## 2023-11-12 DIAGNOSIS — O99519 Diseases of the respiratory system complicating pregnancy, unspecified trimester: Secondary | ICD-10-CM

## 2023-11-12 MED ORDER — RIBOFLAVIN 400 MG PO CAPS
400.0000 mg | ORAL_CAPSULE | Freq: Every day | ORAL | 0 refills | Status: DC
Start: 2023-11-12 — End: 2024-02-07

## 2023-11-12 MED ORDER — FLUTICASONE PROPIONATE 50 MCG/ACT NA SUSP
2.0000 | Freq: Every day | NASAL | 6 refills | Status: DC
Start: 2023-11-12 — End: 2024-02-07

## 2023-11-12 NOTE — Progress Notes (Signed)
  Patient Name: Molly Mendez Date of Birth: 08/05/2001 [facility] Prenatal Visit  TYNLEIGH BIRT [name] is a 23 y.o. G2P0 at [redacted]w[redacted]d here for routine follow up. She is dated by early ultrasound.  She reports no leaking, starting to feels fetal movement (fluttering now). She denies vaginal bleeding.  See flow sheet for details.  Migraines 500 mg tylenol 1 time, 2 times a weeks -- new since pregnancy. Photophobia,, head throbbing frontal. No nausea. Phonophobia. Lasts for whole night. Some occasional vomiting but not associated with headache. No fevers. No coughing.  Nosebleeds-blows it a lot very congested - end of first trimester when having congestion. Not every day   Vitals:   11/12/23 1106  BP: 110/68  Pulse: 90   General: Well appearing, NAD, awake, alert, responsive to questions Head: Normocephalic atraumatic, severe nasal congestion bilaterally, no tenderness to maxillary or facial sinuses, no oral exudates Respiratory:chest rises symmetrically,  no increased work of breathing Abdomen: Soft, non-tender Extremities: Moves upper and lower extremities freely Neuro: CN II: PERRL CN III, IV,VI: EOMI CV V: Normal sensation in V1, V2, V3 CVII: Symmetric smile and brow raise CN VIII: Normal hearing CN IX,X: Symmetric palate raise  CN XI: 5/5 shoulder shrug CN XII: Symmetric tongue protrusion  UE and LE strength 5/5 Normal sensation in UE and LE bilaterally  No ataxia with finger to nose  A/P: Pregnancy at [redacted]w[redacted]d.  Doing well.    Routine Prenatal Care:  Dating reviewed, dating tab is correct Fetal heart tones Appropriate Influenza vaccine previously administered.  05/2023 COVID vaccination was not discussed The patient has the following indication for screening preexisting diabetes: 1 hour glucose elevated- 3 hour normal Anatomy ultrasound ordered to be scheduled at 18-20 weeks. Patient is interested in genetic screening - panorama LR female   Pregnancy education including expected weight gain in pregnancy, OTC medication use, continued use of prenatal vitamin, smoking cessation if applicable, and nutrition in pregnancy.   Bleeding and pain precautions reviewed. The patient has the following indications for aspirinto begin 81 mg at 12-16 weeks: One high risk condition: no single high risk condition  MORE than one moderate risk condition: obesity and low SES   Aspirin was  recommended today based upon above risk factors (one high risk condition or more than one moderate risk factor)   2. Pregnancy issues include the following and were addressed as appropriate today:  Headache/Nasal congestion-neuro exam benign, severe congestion on nasal exam--most likely pregnancy rhinitis, treated with Flonase and also started on riboflavin for headache prophylaxis during pregnancy.  Less likely to be venous thrombosis given normal neurologic examination however keep this in mind if not improving with Flonase/riboflavin and discussed red flags with patient. Problem list  and pregnancy box updated: Yes.   Follow up 4 weeks.

## 2023-11-12 NOTE — Patient Instructions (Signed)
Please make a follow up appointment in 4 weeks. I am sending in flonase and riboflavin for headache prevention, if not improving please return to [place] sooner  Prenatal Classes Go to [email or web address] for more information on the pregnancy and child birth classes that [facility] has to offer.   Pregnancy Related Return Precautions The follow are signs/symptoms that are abnormal in pregnancy and may require further evaluation by a physician: Go to the MAU at [facility] if: You have cramping/contractions that do not go away with drinking water, especially if they are lasting 30 seconds to 1.5 minutes, coming and going every 5-10 minutes for an hour or more, or are getting stronger and you cannot walk or talk while having a contraction/cramp. Your water breaks.  Sometimes it is a big gush of fluid, sometimes it is just a trickle that keeps getting your underwear wet or running down your legs You have vaginal bleeding.    You do not feel your baby moving like normal.  If you do not, get something to eat and drink (something cold or something with sugar like peanut butter or juice) and lay down and focus on feeling your baby move. If your baby is still not moving like normal, you should go to MAU. You should feel your baby move 6 times in one hour, or 10 times in two hours. You have a persistent headache that does not go away with 1 g of Tylenol, vision changes, chest pain, difficulty breathing, severe pain in your right upper abdomen, worsening leg swelling- these can all be signs of high blood pressure in pregnancy and need to be evaluated by a provider immediately  These are all concerning in pregnancy and if you have any of these I recommend you call your PCP and present to the Maternity Admissions Unit (map below) for further evaluation.  For any pregnancy-related emergencies, please go to the Maternity Admissions Unit in the [facility] at [facility]. You will use hospital Entrance C.    Our clinic number is [phone].

## 2023-11-12 NOTE — Telephone Encounter (Signed)
Spoke with patient informed of Ultra [facility] at [address] on Thur Mar. 13th at 8:15a. Patient took down info. Aquilla Solian, CMA

## 2023-11-16 ENCOUNTER — Encounter: Payer: Medicaid Other | Admitting: Student

## 2023-12-05 ENCOUNTER — Other Ambulatory Visit: Payer: Self-pay | Admitting: Family Medicine

## 2023-12-05 DIAGNOSIS — Z3A20 20 weeks gestation of pregnancy: Secondary | ICD-10-CM

## 2023-12-06 ENCOUNTER — Encounter: Payer: Self-pay | Admitting: *Deleted

## 2023-12-06 ENCOUNTER — Ambulatory Visit: Payer: Medicaid Other | Attending: Family Medicine

## 2023-12-06 ENCOUNTER — Other Ambulatory Visit: Payer: Self-pay

## 2023-12-06 ENCOUNTER — Ambulatory Visit (HOSPITAL_BASED_OUTPATIENT_CLINIC_OR_DEPARTMENT_OTHER): Payer: Medicaid Other | Admitting: Obstetrics

## 2023-12-06 ENCOUNTER — Ambulatory Visit: Payer: Medicaid Other | Admitting: *Deleted

## 2023-12-06 ENCOUNTER — Other Ambulatory Visit: Payer: Self-pay | Admitting: *Deleted

## 2023-12-06 VITALS — BP 116/79 | HR 94

## 2023-12-06 DIAGNOSIS — Z3A24 24 weeks gestation of pregnancy: Secondary | ICD-10-CM | POA: Diagnosis not present

## 2023-12-06 DIAGNOSIS — O99342 Other mental disorders complicating pregnancy, second trimester: Secondary | ICD-10-CM | POA: Diagnosis not present

## 2023-12-06 DIAGNOSIS — E669 Obesity, unspecified: Secondary | ICD-10-CM

## 2023-12-06 DIAGNOSIS — Z363 Encounter for antenatal screening for malformations: Secondary | ICD-10-CM | POA: Insufficient documentation

## 2023-12-06 DIAGNOSIS — Z362 Encounter for other antenatal screening follow-up: Secondary | ICD-10-CM

## 2023-12-06 DIAGNOSIS — Z3A20 20 weeks gestation of pregnancy: Secondary | ICD-10-CM

## 2023-12-06 DIAGNOSIS — O99212 Obesity complicating pregnancy, second trimester: Secondary | ICD-10-CM | POA: Diagnosis not present

## 2023-12-06 DIAGNOSIS — E282 Polycystic ovarian syndrome: Secondary | ICD-10-CM

## 2023-12-06 DIAGNOSIS — F419 Anxiety disorder, unspecified: Secondary | ICD-10-CM | POA: Insufficient documentation

## 2023-12-06 DIAGNOSIS — Z3689 Encounter for other specified antenatal screening: Secondary | ICD-10-CM | POA: Diagnosis not present

## 2023-12-06 DIAGNOSIS — O99282 Endocrine, nutritional and metabolic diseases complicating pregnancy, second trimester: Secondary | ICD-10-CM | POA: Diagnosis not present

## 2023-12-06 NOTE — Progress Notes (Signed)
 MFM Consult Note  Molly Mendez is currently at 24 weeks and 1 day.  She was seen due to maternal obesity with a BMI of 34.5.  She denies any problems in her current pregnancy.  The patient reports that she has possible spine issues with a possible slipped disc.  She had a cell free DNA test earlier in her pregnancy which indicated a low risk for trisomy 76, 54, and 13. A female fetus is predicted.   She was informed that the fetal growth and amniotic fluid level were appropriate for her gestational age.  The overall EFW obtained today measured at the 12th percentile.  There were no obvious fetal anomalies noted on today's ultrasound exam.  The limitations of ultrasound in the detection of all anomalies was discussed.    Due to her possible spine issues, the patient should have an anesthesia consult when she is admitted for delivery.    Due to the low normal EFW obtained today, a follow-up growth scan was scheduled in 4 weeks.    The patient stated that all of her questions were answered today.  A total of 30 minutes was spent counseling and coordinating the care for this patient.  Greater than 50% of the time was spent in direct face-to-face contact.

## 2023-12-07 ENCOUNTER — Encounter: Payer: Self-pay | Admitting: Family Medicine

## 2023-12-07 DIAGNOSIS — M549 Dorsalgia, unspecified: Secondary | ICD-10-CM | POA: Insufficient documentation

## 2023-12-18 NOTE — Progress Notes (Signed)
  [facility] Prenatal Visit  Molly Mendez is a 23 y.o. G2P0010 at [redacted]w[redacted]d here for routine follow up. She is dated by early ultrasound.  She reports  Right sided hip pain in the mornings, like muscles spasm . She reports fetal movement. Denies vaginal bleeding, loss of fluid, or contractions.  See flow sheet for details.  A/P: Pregnancy at [redacted]w[redacted]d.  Doing well.   Dating reviewed, dating tab is correct Fetal heart tones Appropriate 145 Fundal height within expected range.  25 cm Influenza vaccine previously administered.  05/2023  COVID vaccination was discussed and declined.  Screening for gestational diabetes completed today and appropriate. . Even if prior 1 hour test completed, if before 24 weeks, repeat. Screening before 24 weeks is screening for preexisting DM  Pregnancy education completed including: fetal growth, breastfeeding, contraception, and expected weight gain in pregnancy.   The patient does not have a history of Cesarean delivery and no referral to [facility] is indicated Scheduled for [facility] during second trimester on 02/07/24. Preterm labor, bleeding, and pain precautions given.    2. Pregnancy issues include the following and were addressed as appropriate today:   Right sided hip pain in the am, muscular in nature, resolves with activity. Will consider muscle relaxer if continues to be an issue, however patient note's not an issue right now.   Problem list and pregnancy box updated: Yes.   Follow up 4 weeks.

## 2023-12-18 NOTE — Patient Instructions (Signed)
 It was great to see you today! Thank you for choosing [facility] for your obstetric care. Molly Mendez was seen for ob visit.  Baby continues to grow, which is great. We have discussed checking for gestational diabetes and blood pressure monitoring.  Call your OB Clinic or go to [facility] if: You begin to have strong, frequent contractions Your water breaks.  Sometimes it is a big gush of fluid, sometimes it is just a trickle that keeps getting your panties wet or running down your legs You have vaginal bleeding.  It is normal to have a small amount of spotting if your cervix was checked.  You don't feel your baby moving like normal.  If you don't, get you something to eat and drink and lay down and focus on feeling your baby move.  You should feel at least 10 movements in 2 hours.  If you don't, you should call the office or go to [facility].   If you haven't already, sign up for My Chart to have easy access to your labs results, and communication with your primary care physician.  You should return to our clinic No follow-ups on file.  I recommend that you always bring your medications to each appointment as this makes it easy to ensure you are on the correct medications and helps [place] not miss refills when you need them.  Please arrive 15 minutes before your appointment to ensure smooth check in process.  We appreciate your efforts in making this happen.  Please call the clinic at [phone] if your symptoms worsen or you have any concerns.  Thank you for allowing me to participate in your care, Bess Kinds, MD 12/18/2023, 8:11 AM PGY-***, [facility]  Fue genial verte hoy! Karl Pock por elegir [facility] para su atencin obsttrica. Molly Mendez fue visto para una visita de obstetricia  El beb sigue creciendo, lo cual es genial. Hemos discutido el control de la diabetes gestacional y el control de la  presin arterial.  Vaya a MAU en [facility] [place] si: ? Tiene calambres/contracciones que no desaparecen con agua potable ? Tu agua se rompe. A veces es un gran chorro de lquido, a veces es solo un goteo que sigue mojando tu ropa interior o corriendo por tus piernas. ? Tiene sangrado vaginal. ? No siente que su beb se mueva normalmente. Si no lo hace, busque algo de comer y beber, acustese y concntrese en sentir cmo se mueve su beb. Si su beb todava no se mueve con normalidad, debe ir a MAU.  Si an no lo ha hecho, regstrese en My Chart para acceder fcilmente a los resultados de sus anlisis y comunicarse con su mdico de Marine scientist.   Debe regresar a nuestra clnica No follow-ups on file.  Le recomiendo que siempre traiga sus medicamentos a cada cita, ya que esto facilita asegurarse de que est tomando los medicamentos correctos y nos ayuda a no perder los resurtidos [employer] necesite.  Llegue 15 minutos antes de su cita para garantizar un proceso de registro sin problemas. Agradecemos sus esfuerzos para que esto suceda.  Llame a la clnica al [phone] si sus sntomas empeoran o si tiene alguna inquietud.  Gracias por permitirme participar en su cuidado, Bess Kinds, MD 12/18/2023, 8:11 AM PGY-***, [facility]

## 2023-12-20 ENCOUNTER — Ambulatory Visit: Admitting: Student

## 2023-12-20 ENCOUNTER — Encounter: Payer: Self-pay | Admitting: Student

## 2023-12-20 VITALS — BP 100/80 | HR 80 | Wt 203.0 lb

## 2023-12-20 DIAGNOSIS — Z3A26 26 weeks gestation of pregnancy: Secondary | ICD-10-CM

## 2023-12-20 LAB — POCT 1 HR PRENATAL GLUCOSE: Glucose 1 Hr Prenatal, POC: 101 mg/dL

## 2023-12-28 DIAGNOSIS — O99210 Obesity complicating pregnancy, unspecified trimester: Secondary | ICD-10-CM | POA: Insufficient documentation

## 2024-01-03 ENCOUNTER — Ambulatory Visit: Attending: Obstetrics

## 2024-01-03 ENCOUNTER — Other Ambulatory Visit: Payer: Self-pay

## 2024-01-03 ENCOUNTER — Ambulatory Visit: Admitting: Obstetrics and Gynecology

## 2024-01-03 VITALS — BP 112/75

## 2024-01-03 DIAGNOSIS — E669 Obesity, unspecified: Secondary | ICD-10-CM | POA: Diagnosis not present

## 2024-01-03 DIAGNOSIS — Z3A28 28 weeks gestation of pregnancy: Secondary | ICD-10-CM

## 2024-01-03 DIAGNOSIS — O99343 Other mental disorders complicating pregnancy, third trimester: Secondary | ICD-10-CM | POA: Diagnosis not present

## 2024-01-03 DIAGNOSIS — O99213 Obesity complicating pregnancy, third trimester: Secondary | ICD-10-CM

## 2024-01-03 DIAGNOSIS — O99210 Obesity complicating pregnancy, unspecified trimester: Secondary | ICD-10-CM | POA: Insufficient documentation

## 2024-01-03 DIAGNOSIS — Z362 Encounter for other antenatal screening follow-up: Secondary | ICD-10-CM | POA: Diagnosis present

## 2024-01-03 DIAGNOSIS — F419 Anxiety disorder, unspecified: Secondary | ICD-10-CM

## 2024-01-03 DIAGNOSIS — Z3483 Encounter for supervision of other normal pregnancy, third trimester: Secondary | ICD-10-CM

## 2024-01-03 NOTE — Progress Notes (Signed)
 After review, MFM consult with provider is not indicated for today  Noralee Space, MD 01/03/2024 12:00 PM  [facility]

## 2024-01-07 ENCOUNTER — Telehealth: Payer: Self-pay | Admitting: Student

## 2024-01-07 NOTE — Telephone Encounter (Signed)
-----   Message from [place] sent at 01/04/2024  6:42 PM EDT ----- Admin team, can you reach out to patient and get her scheduled for an appointment ASAP?  She is not scheduled until 5/15, which is too far out.  Thanks! Candee Cha, MD

## 2024-01-07 NOTE — Telephone Encounter (Signed)
 LVM for patient to call back to schedule.

## 2024-01-10 ENCOUNTER — Ambulatory Visit (INDEPENDENT_AMBULATORY_CARE_PROVIDER_SITE_OTHER): Admitting: Student

## 2024-01-10 VITALS — BP 119/77 | HR 89 | Wt 207.8 lb

## 2024-01-10 DIAGNOSIS — Z3A29 29 weeks gestation of pregnancy: Secondary | ICD-10-CM

## 2024-01-10 NOTE — Progress Notes (Addendum)
  [facility] Prenatal Visit  Molly Mendez [name] is a 23 y.o. G2P0010 at [redacted]w[redacted]d here for routine follow up. She is dated by early ultrasound.  She reports backache, no bleeding, no contractions, no cramping, and no leaking. She reports fetal movement. She denies vaginal bleeding, contractions, or loss of fluid. See flow sheet for details.  Seen by Dr. Arnie Bibber at MFM for concerns of IUGR at the time, US  at [facility] was 10%. Repeat US  at 28wk showed EFW at 20%. Dr. Arnie Bibber recommend MFM consult not indicated at this time. FM will continue to manage prenatal care    Vitals:   01/10/24 0831  BP: 119/77  Pulse: 89   Fundal height: 28 cm Fetal heart tone: 147 BPM  General: Alert, well appearing, NAD CV: RRR, no murmurs, normal S1/S2 Pulm: CTAB, good WOB on RA, no crackles or wheezing Abd: Soft, no distension, no tenderness Skin: dry, warm Ext: No BLE edema, +2 Pedal and radial pulse.   A/P: Pregnancy at [redacted]w[redacted]d.  Doing well.   Routine prenatal care:  Dating reviewed, dating tab is correct Fetal heart tones Appropriate Fundal height within expected range.  Infant feeding choice: Breastfeeding Contraception choice: Condoms Infant circumcision desired not applicable  The patient does not have a history of Cesarean delivery and no referral to [facility] is indicated Influenza vaccine not administered as not influenza season.   Tdap was not given today, patient provided with letter to receive vaccine at HD 1 hour glucola, CBC, RPR, and HIV were obtained today.    Rh status was reviewed and patient does not need Rhogam.  Rhogam was not given today.  Pregnancy medical home and PHQ-9 forms were not done today and reviewed.   Childbirth and education classes were not offered. Pregnancy education regarding benefits of breastfeeding, contraception, fetal growth, expected weight gain, and safe infant sleep were discussed.  Preterm labor and fetal movement  precautions reviewed.  2. Pregnancy issues include the following and were addressed as appropriate today:   Ordered routine lab for HIV, RPR, CBC   Rho gham not indicated at this time. A+  Continue Aspirin and Prenatal vitamin  Patient will prefer  Vaginal delivery without Epidural   Problem list and pregnancy box updated: Yes.   Patient scheduled in [facility] during third trimester on 5/15.  Follow up 2 weeks.

## 2024-01-10 NOTE — Patient Instructions (Addendum)
  It was great to see you today! Thank you for choosing [facility] for your obstetric care. Molly Mendez was seen for ob visit.  We will be giving you your TDAP vaccine between 27 and 36 weeks  We will be rechecking your HIV, CBC, and RPR at 28 weeks, and we will call or message with those results   There are many options of contraception for you moving forward after giving birth to baby, please consider all options   Breast feeding can be daunting, please let us  know if you need assistance with this as we continue through your pregnancy   We will start seeing you every 2 weeks until delivery.  Knowledge about your newborn  baby needs to sleep in bassinet after birth, with tight sheet that is fitted and no stuffed animals or other item in the area to prevent suffocation and hepatitis B vaccine is highly recommended after birth and will be offered to you   Call your [facility] or go to [facility] if: You begin to have strong, frequent contractions Your water breaks.  Sometimes it is a big gush of fluid, sometimes it is just a trickle that keeps getting your panties wet or running down your legs You have vaginal bleeding.  It is normal to have a small amount of spotting if your cervix was checked.  You don't feel your baby moving like normal.  If you don't, get you something to eat and drink and lay down and focus on feeling your baby move.  You should feel at least 10 movements in 2 hours.  If you don't, you should call the office or go to [facility].   If you haven't already, sign up for My Chart to have easy access to your labs results, and communication with your primary care physician.  You should return to our clinic in 2 weeks.  I recommend that you always bring your medications to each appointment as this makes it easy to ensure you are on the correct medications and helps us  not miss refills when you need them.  Please arrive 15 minutes before your  appointment to ensure smooth check in process.  We appreciate your efforts in making this happen.  Please call the clinic at [phone] if your symptoms worsen or you have any concerns.  Thank you for allowing me to participate in your care,  Goble Last, MD PGY-3, [facility]

## 2024-01-11 LAB — CBC
Hematocrit: 36.2 % (ref 34.0–46.6)
Hemoglobin: 11.8 g/dL (ref 11.1–15.9)
MCH: 27.9 pg (ref 26.6–33.0)
MCHC: 32.6 g/dL (ref 31.5–35.7)
MCV: 86 fL (ref 79–97)
Platelets: 229 10*3/uL (ref 150–450)
RBC: 4.23 x10E6/uL (ref 3.77–5.28)
RDW: 12.5 % (ref 11.7–15.4)
WBC: 10 10*3/uL (ref 3.4–10.8)

## 2024-01-11 LAB — HIV ANTIBODY (ROUTINE TESTING W REFLEX): HIV Screen 4th Generation wRfx: NONREACTIVE

## 2024-01-11 LAB — RPR: RPR Ser Ql: NONREACTIVE

## 2024-01-23 NOTE — Progress Notes (Signed)
  [facility] Prenatal Visit  Molly Mendez [name] is a 23 y.o. G2P0010 at [redacted]w[redacted]d here for routine follow up. She is dated by early ultrasound.  She reports occasional Braxton-Hicks contractions.  She reports fetal movement. She denies vaginal bleeding, contractions, or loss of fluid.  See flow sheet for details.  Vitals:   01/25/24 0858  BP: 108/60  Pulse: 88     A/P: Pregnancy at [redacted]w[redacted]d.  Doing well.   Routine prenatal care:  Dating reviewed, dating tab is correct Fetal heart tones: Appropriate - 128 Fundal height: within expected range.  The patient does not have a history of HSV and valacyclovir is not indicated at this time.  The patient does not have a history of Cesarean delivery and no referral to [facility] is indicated Infant feeding choice: Both  Contraception choice: Condoms Infant circumcision desired not applicable Influenza vaccine not administered as not influenza season.   Tdap was not given today. Patient provided with letter to receive vaccine at HD  Abrysvo RSV vaccine was discussed today. Out of season but recommended for newborn in the fall Childbirth and education classes were offered. Pregnancy education regarding benefits of breastfeeding, contraception, fetal growth, expected weight gain, and safe infant sleep were discussed.  Preterm labor and fetal movement precautions reviewed.   2. Pregnancy issues include the following and were addressed as appropriate today:  Taking ASA and prenatal vitamin  Desires unmedicated vaginal birth, interested in possible water  bruise but wants to do more research.  Provided information about water  risk class at [facility].  Problem list and pregnancy box updated: Yes.   Scheduled for Ob [facility] clinic in third trimester on 5/15.   Follow up 2 weeks.

## 2024-01-25 ENCOUNTER — Ambulatory Visit (INDEPENDENT_AMBULATORY_CARE_PROVIDER_SITE_OTHER): Admitting: Family Medicine

## 2024-01-25 ENCOUNTER — Telehealth: Payer: Self-pay | Admitting: Student

## 2024-01-25 VITALS — BP 108/60 | HR 88 | Wt 210.4 lb

## 2024-01-25 DIAGNOSIS — Z3403 Encounter for supervision of normal first pregnancy, third trimester: Secondary | ICD-10-CM | POA: Diagnosis not present

## 2024-01-25 NOTE — Telephone Encounter (Signed)
 Patient dropped off FMLA paperwork to be completed. Last DOS was 01/25/24. Placed in [employer].

## 2024-01-25 NOTE — Patient Instructions (Addendum)
 Please make a follow up appointment in 2 weeks on 5/15.  Prenatal Classes Go to [email or web address] for more information on the pregnancy and child birth classes that [place] has to offer.   Vaccinations If you are with the Adopt a Mom Program and need vaccines, these are done the [facility] on [address] [place]. The number to call to schedule an appt is [phone]  Pregnancy Related Return Precautions The follow are signs/symptoms that are abnormal in pregnancy and may require further evaluation by a physician: Go to the MAU at [facility] if: You have cramping/contractions that do not go away with drinking water , especially if they are lasting 30 seconds to 1.5 minutes, coming and going every 5-10 minutes for an hour or more, or are getting stronger and you cannot walk or talk while having a contraction/cramp. Your water  breaks.  Sometimes it is a big gush of fluid, sometimes it is just a trickle that keeps getting your underwear wet or running down your legs You have vaginal bleeding.    You do not feel your baby moving like normal.  If you do not, get something to eat and drink (something cold or something with sugar like peanut butter or juice) and lay down and focus on feeling your baby move. If your baby is still not moving like normal, you should go to MAU. You should feel your baby move 6 times in one hour, or 10 times in two hours. You have a persistent headache that does not go away with 1 g of Tylenol , vision changes, chest pain, difficulty breathing, severe pain in your right upper abdomen, worsening leg swelling- these can all be signs of high blood pressure in pregnancy and need to be evaluated by a provider immediately  These are all concerning in pregnancy and if you have any of these I recommend you call your PCP and present to the Maternity Admissions Unit (map below) for further  evaluation.  For any pregnancy-related emergencies, please go to the Maternity Admissions Unit in the [facility] at [facility]. You will use hospital Entrance C.    Our clinic number is [phone].

## 2024-01-28 NOTE — Telephone Encounter (Signed)
 Placed in MDs box to be filled out. Molly Mendez Bruna Potter, CMA

## 2024-02-04 NOTE — Telephone Encounter (Signed)
 Patient called and informed that forms are ready for pick up. Copy made and placed in batch scanning. Original placed at front desk for pick up.   Veronda Prude, RN

## 2024-02-07 ENCOUNTER — Ambulatory Visit (INDEPENDENT_AMBULATORY_CARE_PROVIDER_SITE_OTHER): Admitting: Family Medicine

## 2024-02-07 VITALS — BP 124/80 | HR 79 | Wt 212.2 lb

## 2024-02-07 DIAGNOSIS — Z3A33 33 weeks gestation of pregnancy: Secondary | ICD-10-CM | POA: Diagnosis not present

## 2024-02-07 NOTE — Progress Notes (Signed)
  [facility] Prenatal Visit  Molly Mendez [name] is a 23 y.o. G2P0010 at [redacted]w[redacted]d here for routine follow up. She is dated by early ultrasound.  She reports one possible contraction, but no LOF, no VB.  She reports fetal movement. She denies vaginal bleeding, or loss of fluid.  See flow sheet for details.  Vitals:   02/07/24 0848  BP: 124/80  Pulse: 79    A/P: Pregnancy at [redacted]w[redacted]d.  Doing well.   Routine prenatal care:  Dating reviewed, dating tab is correct Fetal heart tones: Appropriate Fundal height: within expected range.  The patient does not have a history of HSV and valacyclovir is not indicated at this time.  The patient does not have a history of Cesarean delivery and no referral to [facility] is indicated Infant feeding choice: Both  Contraception choice: Condoms Infant circumcision desired not applicable Influenza vaccine not administered as not influenza season.   Tdap was not given today. Abrysvo RSV vaccine was not discussed today as patient is not eligible  Childbirth and education classes were offered. Pregnancy education regarding benefits of breastfeeding, contraception, fetal growth, expected weight gain, and safe infant sleep were discussed.  Preterm labor and fetal movement precautions reviewed.   2. Pregnancy issues include the following and were addressed as appropriate today:  Spine Issues- patient to have anesthesia consult on admission to L&D - Maternal obesity- MFM EFW 20-21% - Desires Waterbirth, referral placed today, staff message sent.  Problem list and pregnancy box updated: Yes.    Follow up 2 weeks.

## 2024-02-07 NOTE — Patient Instructions (Addendum)
 Please make a follow up appointment in 2 weeks.  You do not need to start collecting breast milk until 36 weeks of pregnancy.  Prenatal Classes Go to [email or web address] for more information on the pregnancy and child birth classes that [place] has to offer.   Vaccinations If you are with the Adopt a Mom Program and need vaccines, these are done the [facility] Department on [address] [place]. The number to call to schedule an appt is [phone]  Pregnancy Related Return Precautions The follow are signs/symptoms that are abnormal in pregnancy and may require further evaluation by a physician: Go to the MAU at [facility] at [facility] if: You have cramping/contractions that do not go away with drinking water , especially if they are lasting 30 seconds to 1.5 minutes, coming and going every 5-10 minutes for an hour or more, or are getting stronger and you cannot walk or talk while having a contraction/cramp. Your water  breaks.  Sometimes it is a big gush of fluid, sometimes it is just a trickle that keeps getting your underwear wet or running down your legs You have vaginal bleeding.    You do not feel your baby moving like normal.  If you do not, get something to eat and drink (something cold or something with sugar like peanut butter or juice) and lay down and focus on feeling your baby move. If your baby is still not moving like normal, you should go to MAU. You should feel your baby move 6 times in one hour, or 10 times in two hours. You have a persistent headache that does not go away with 1 g of Tylenol , vision changes, chest pain, difficulty breathing, severe pain in your right upper abdomen, worsening leg swelling- these can all be signs of high blood pressure in pregnancy and need to be evaluated by a provider immediately  These are all concerning in pregnancy and if you have any of these I recommend you call your PCP  and present to the Maternity Admissions Unit (map below) for further evaluation.  For any pregnancy-related emergencies, please go to the Maternity Admissions Unit in the Women's & Children's Center at [facility]. You will use hospital Entrance C.    Our clinic number is [phone].   Dr Drue Gerald   AREA FAMILY PRACTICE PHYSICIANS  [place] ([id number]) [facility] [address] [place], [place] [address] [phone] Mon-Fri 8:30-12:30, 1:30-5:00 Accepting [facility] Family Medicine at [facility] [address], [place], [place] [address] [phone] Mon-Fri 8:00-5:30 [facility] [address], [place], [place] [address] [phone] [place], Thur, Fri 8:30-5:00, Wed 10:00-7:00 (closed 1-2pm) Accepting [facility] [address], [place], [place]  [address] Phone - [phone]   Fax - [phone]  [place] [phone]) [facility] [address] [place], [place] [address] [phone] Mon-Fri 8:00-5:00 [facility] Pediatrics at [facility])  [address] Zada Herrlich [place], [place] [address] [phone] Mon-Fri 8:30-5:30, Sat (Oct.-Mar.) 9:00-1:00 Accepting Medicaid  [place] [phone]) [facility] at [facility] [address] [place], [place] [address] [phone] Mon-Fri 8:00-5:00  [place] [phone]) [facility] at [facility] [address], [place], [place] [address] [phone] Mon-Fri 8:00-5:00 [facility] [place] [address] [place], [place], [place] [address] [phone] Mon-Fri 8:00-5:00 [place] [facility] [address] [place], [place] [address] [phone] Mon-Fri 8:00-5:00  [facility] [address] [place] [address] [phone] Mon-Fri  7:30-5:30  [place] [phone] & [phone]) [facility] [address], [place], [place] [address] [phone] Mon-Thur 8:00-6:00 Accepting [facility] [address] Johnella Naas [place], [place] [address] [phone] Mon-Thur 7:30-7:30, Fri 7:30-4:30 Accepting [facility] at [facility] [address] [place], [place]  [address] [phone]   Fax - [phone]  [place] [phone] & [phone]) Adult nurse HealthCare at [facility] [address] [place], [place] [address] [phone] Mon-Fri 7:00-5:00 [facility] Family Medicine [address], [place], [place] [address] [phone] Mon-Fri 8:00-5:00 Accepting Medicaid [facility] - [facility] [address] [place], [place], [place] [address] [phone] Mon-Fri 8:00-5:00 Accepting Medicaid  [place] [phone]) [facility] Primary Care at [facility] [address] Johnella Naas [place], [place] [address] [phone] Mon-Fri 8:00-5:00 [facility] - [facility] at [facility]) [address], [place], [place] [address] [phone] Mon-Fri 8:00-5:00 Accepting Medicaid [facility] ([facility] [employer]) [address], [place], [place] [address] [phone] Mon-Fri 8:00-5:30, Sat&Sun by appointment (phones open at 8:30) Accepting [facility] [phone] & [phone]) [facility] [address], [place], [place] [address] [phone] Mon-Thur 8:00-7:00, Fri 8:00-5:00, Sat 8:00-12:00, Sun 9:00-12:00 Accepting Medicaid [facility] - Family Medicine at [facility] [address] [place], [place] [address] [phone] Mon-Thur 8:00-5:00 Accepting Medicaid [facility] - Family Medicine at [place] [address]  Zada Herrlich [place], [place] [address] [phone] Mon-Fri 8:00-5:30, Sat (Oct.-Mar.) 9:00-1:00 Accepting [facility]  [place] [phone]) [facility] Family Medicine [address] [place], [place] [address] [phone] Mon-Fri 8:00-5:00 Accepting [facility]   [place] [phone]) [place] [facility] at [facility] [address], [place], [place] [address] [phone] Mon-Fri 8:00-5:00 [place] [facility] at [facility] [address] Terry Ficks [place], [place] [address] [phone] Mon-Fri 8:00-5:00 [facility] - [place] [address] [facility] [address], [place], [place] [address] [phone] Mon-Fri 8:00-5:00 After hours clinic [facility] [place], [place] [address]) [phone] Mon-Fri 5:00-8:00, Sat 12:00-6:00, Sun 10:00-4:00 Accepting Medicaid [facility] at [facility]. [address], [place], [place]  [address] [phone]   Fax - [phone]  [place] [phone]) Adult nurse HealthCare at [facility] [address] [place], [place] [address] [phone] Mon-Fri 8:00-5:00 [facility] Family Medicine - [place] [facility] [place]) [address] [place], [place] [address] [phone] Mon-Thur 8:00-7:00, Fri 8:00-5:00, Sat 8:00-12:00

## 2024-02-08 ENCOUNTER — Ambulatory Visit: Payer: Self-pay | Admitting: Family Medicine

## 2024-02-08 LAB — VARICELLA ZOSTER ANTIBODY, IGG: Varicella zoster IgG: NONREACTIVE

## 2024-02-14 ENCOUNTER — Encounter

## 2024-02-28 ENCOUNTER — Inpatient Hospital Stay (HOSPITAL_COMMUNITY)
Admission: AD | Admit: 2024-02-28 | Discharge: 2024-02-28 | Disposition: A | Discharge status: Home / Self Care | Attending: Obstetrics and Gynecology | Admitting: Obstetrics and Gynecology

## 2024-02-28 ENCOUNTER — Encounter (HOSPITAL_COMMUNITY): Payer: Self-pay | Admitting: Obstetrics and Gynecology

## 2024-02-28 ENCOUNTER — Other Ambulatory Visit: Payer: Self-pay

## 2024-02-28 ENCOUNTER — Encounter: Admitting: Family Medicine

## 2024-02-28 ENCOUNTER — Ambulatory Visit: Admitting: Family Medicine

## 2024-02-28 ENCOUNTER — Other Ambulatory Visit (HOSPITAL_COMMUNITY)
Admission: RE | Admit: 2024-02-28 | Discharge: 2024-02-28 | Disposition: A | Discharge status: Home / Self Care | Source: Ambulatory Visit | Attending: Family Medicine | Admitting: Family Medicine

## 2024-02-28 VITALS — BP 121/88 | HR 89 | Wt 217.2 lb

## 2024-02-28 DIAGNOSIS — Z23 Encounter for immunization: Secondary | ICD-10-CM | POA: Diagnosis not present

## 2024-02-28 DIAGNOSIS — Z3A36 36 weeks gestation of pregnancy: Secondary | ICD-10-CM | POA: Diagnosis not present

## 2024-02-28 DIAGNOSIS — Z113 Encounter for screening for infections with a predominantly sexual mode of transmission: Secondary | ICD-10-CM | POA: Insufficient documentation

## 2024-02-28 DIAGNOSIS — L2989 Other pruritus: Secondary | ICD-10-CM | POA: Insufficient documentation

## 2024-02-28 DIAGNOSIS — R030 Elevated blood-pressure reading, without diagnosis of hypertension: Secondary | ICD-10-CM | POA: Diagnosis present

## 2024-02-28 DIAGNOSIS — L299 Pruritus, unspecified: Secondary | ICD-10-CM | POA: Diagnosis not present

## 2024-02-28 DIAGNOSIS — O26893 Other specified pregnancy related conditions, third trimester: Secondary | ICD-10-CM | POA: Diagnosis present

## 2024-02-28 DIAGNOSIS — R1011 Right upper quadrant pain: Secondary | ICD-10-CM | POA: Diagnosis not present

## 2024-02-28 HISTORY — DX: Polycystic ovarian syndrome: E28.2

## 2024-02-28 LAB — URINALYSIS, ROUTINE W REFLEX MICROSCOPIC
Bilirubin Urine: NEGATIVE
Glucose, UA: NEGATIVE mg/dL
Hgb urine dipstick: NEGATIVE
Ketones, ur: NEGATIVE mg/dL
Nitrite: NEGATIVE
Protein, ur: NEGATIVE mg/dL
Specific Gravity, Urine: 1.01 (ref 1.005–1.030)
WBC, UA: >50 WBC/hpf (ref 0–5)
pH: 5 (ref 5.0–8.0)

## 2024-02-28 LAB — COMPREHENSIVE METABOLIC PANEL WITH GFR
ALT: 19 U/L (ref 0–44)
AST: 20 U/L (ref 15–41)
Albumin: 2.7 g/dL — ABNORMAL LOW (ref 3.5–5.0)
Alkaline Phosphatase: 157 U/L — ABNORMAL HIGH (ref 38–126)
Anion gap: 10 (ref 5–15)
BUN: 6 mg/dL (ref 6–20)
CO2: 20 mmol/L — ABNORMAL LOW (ref 22–32)
Calcium: 8.9 mg/dL (ref 8.9–10.3)
Chloride: 105 mmol/L (ref 98–111)
Creatinine, Ser: 0.67 mg/dL (ref 0.44–1.00)
GFR, Estimated: >60 mL/min (ref >60)
Glucose, Bld: 117 mg/dL — ABNORMAL HIGH (ref 70–99)
Potassium: 3.9 mmol/L (ref 3.5–5.1)
Sodium: 135 mmol/L (ref 135–145)
Total Bilirubin: 0.3 mg/dL (ref 0.0–1.2)
Total Protein: 6.3 g/dL — ABNORMAL LOW (ref 6.5–8.1)

## 2024-02-28 LAB — CBC
HCT: 37.6 % (ref 36.0–46.0)
Hemoglobin: 12.7 g/dL (ref 12.0–15.0)
MCH: 28.1 pg (ref 26.0–34.0)
MCHC: 33.8 g/dL (ref 30.0–36.0)
MCV: 83.2 fL (ref 80.0–100.0)
Platelets: 218 10*3/uL (ref 150–400)
RBC: 4.52 MIL/uL (ref 3.87–5.11)
RDW: 13.3 % (ref 11.5–15.5)
WBC: 9.5 10*3/uL (ref 4.0–10.5)
nRBC: 0 % (ref 0.0–0.2)

## 2024-02-28 LAB — PROTEIN / CREATININE RATIO, URINE
Creatinine, Urine: 53 mg/dL
Protein Creatinine Ratio: 0.19 mg/mg{creat} — ABNORMAL HIGH (ref 0.00–0.15)
Total Protein, Urine: 10 mg/dL

## 2024-02-28 MED ORDER — HYDROXYZINE PAMOATE 25 MG PO CAPS: 25.0000 mg | ORAL_CAPSULE | Freq: Three times a day (TID) | ORAL | 0 refills | Status: DC | PRN

## 2024-02-28 MED ORDER — URSODIOL 300 MG PO CAPS: 300.0000 mg | ORAL_CAPSULE | Freq: Two times a day (BID) | ORAL | 0 refills | Status: DC

## 2024-02-28 NOTE — Progress Notes (Signed)
cer

## 2024-02-28 NOTE — MAU Note (Signed)
 Molly Mendez is a 23 y.o. at [redacted]w[redacted]d here in MAU reporting: sent from [facility] office secondary elevated BP with increased swelling in both hands & feet.  Also states she's seeing flashes of light, denies HA. States also has had itchy palms and feet for the past 3 days but worsened over night.  Denies VB or LOF.  Endorses +FM.  LMP: 06/09/2023 Onset of complaint: 3 days ago Pain score: 0 Vitals:   02/28/24 1111  BP: 120/77  Pulse: (!) 111  Resp: 17  Temp: 97.7 F (36.5 C)  SpO2: 98%     FHT: 153 bpm  Lab orders placed from triage: UA

## 2024-02-28 NOTE — MAU Provider Note (Signed)
 History     CSN: [id number]  Arrival date and time: 02/28/24 1035   Event Date/Time   First Provider Initiated Contact with Patient 02/28/24 1140      Chief Complaint  Patient presents with   Itchy Palms & Feet   BP Evaluation   HPI Molly Mendez is a 23 y.o. G2P0010 at [redacted]w[redacted]d who presents from the office for itching & to evaluate BP.  Reports itching of palms and soles x 3 days..  Denies rash or change in soaps/detergents. Reports some intermittent RUQ abdominal pain x 3 days. Denies contractions, LOF, or vaginal bleeding. Reports good fetal movement.   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/05/2014   PCOS (polycystic ovarian syndrome)    UTI (urinary tract infection) 02/25/2020    Past Surgical History:  Procedure Laterality Date   EYE MUSCLE SURGERY Right x2   as a child   MEDIAN RECTUS REPAIR Right 05/06/2014   Procedure: MEDIAN RECTUS RESECTION;  Surgeon: Hoyle Maclachlan, MD;  Location: [facility];  Service: Ophthalmology;  Laterality: Right;    Family History  Problem Relation Age of Onset   Anxiety disorder Mother    High Cholesterol Mother    High blood pressure Mother    Depression Father    Anxiety disorder Father    Diabetes Father    Anxiety disorder Sister    Depression Sister    Anxiety disorder Brother    Depression Brother    High blood pressure Maternal Grandmother    High blood pressure Maternal Grandfather    High blood pressure Paternal Grandmother    High blood pressure Paternal Grandfather     Social History   Tobacco Use   Smoking status: Former    Types: E-cigarettes    Passive exposure: Never   Smokeless  tobacco: Never  Vaping Use   Vaping status: Former   Substances: Flavoring  Substance Use Topics   Alcohol use: No   Drug use: Never    Allergies:  Allergies  Allergen Reactions   Ibuprofen Other (See Comments)    Burning feeling in stomach    Medications Prior to Admission  Medication Sig Dispense Refill Last Dose/Taking   aspirin EC 81 MG tablet Take 81 mg by mouth daily. Swallow whole.   02/27/2024   Prenatal Vit-Fe Fumarate-FA (PRENATAL MULTIVITAMIN) TABS tablet Take 1 tablet by mouth daily at 12 noon.   02/27/2024    Review of Systems  All other systems reviewed and are negative.  Physical Exam   Blood pressure 116/76, pulse (!) 101, temperature 97.7 F (36.5 C), temperature source Oral, resp. rate 17, height 5\' 3"  (1.6 m), weight 99.1 kg, last menstrual period 06/09/2023, SpO2 98%.  Physical Exam Vitals and nursing note reviewed.  Constitutional:      General: She is not in acute distress.    Appearance: She is well-developed. She is  not ill-appearing.  HENT:     Head: Normocephalic and atraumatic.  Eyes:     General: No scleral icterus.       Right eye: No discharge.        Left eye: No discharge.     Conjunctiva/sclera: Conjunctivae normal.  Pulmonary:     Effort: Pulmonary effort is normal. No respiratory distress.  Neurological:     General: No focal deficit present.     Mental Status: She is alert.  Psychiatric:        Mood and Affect: Mood normal.        Behavior: Behavior normal.    NST:  Baseline: 140 bpm, Variability: Good {> 6 bpm), Accelerations: Reactive, and Decelerations: Absent   MAU Course  Procedures Results for orders placed or performed during the hospital encounter of 02/28/24 (from the past 24 hours)  CBC     Status: None   Collection Time: 02/28/24 11:47 AM  Result Value Ref Range   WBC 9.5 4.0 - 10.5 K/uL   RBC 4.52 3.87 - 5.11 MIL/uL   Hemoglobin 12.7 12.0 - 15.0 g/dL   HCT 16.1 09.6 - 04.5 %   MCV 83.2 80.0 - 100.0 fL   MCH  28.1 26.0 - 34.0 pg   MCHC 33.8 30.0 - 36.0 g/dL   RDW 40.9 81.1 - 91.4 %   Platelets 218 150 - 400 K/uL   nRBC 0.0 0.0 - 0.2 %  Comprehensive metabolic panel with GFR     Status: Abnormal   Collection Time: 02/28/24 11:47 AM  Result Value Ref Range   Sodium 135 135 - 145 mmol/L   Potassium 3.9 3.5 - 5.1 mmol/L   Chloride 105 98 - 111 mmol/L   CO2 20 (L) 22 - 32 mmol/L   Glucose, Bld 117 (H) 70 - 99 mg/dL   BUN 6 6 - 20 mg/dL   Creatinine, Ser 7.82 0.44 - 1.00 mg/dL   Calcium 8.9 8.9 - 95.6 mg/dL   Total Protein 6.3 (L) 6.5 - 8.1 g/dL   Albumin 2.7 (L) 3.5 - 5.0 g/dL   AST 20 15 - 41 U/L   ALT 19 0 - 44 U/L   Alkaline Phosphatase 157 (H) 38 - 126 U/L   Total Bilirubin 0.3 0.0 - 1.2 mg/dL   GFR, Estimated >21 >30 mL/min   Anion gap 10 5 - 15  Urinalysis, Routine w reflex microscopic -Urine, Clean Catch     Status: Abnormal   Collection Time: 02/28/24 11:49 AM  Result Value Ref Range   Color, Urine YELLOW YELLOW   APPearance CLOUDY (A) CLEAR   Specific Gravity, Urine 1.010 1.005 - 1.030   pH 5.0 5.0 - 8.0   Glucose, UA NEGATIVE NEGATIVE mg/dL   Hgb urine dipstick NEGATIVE NEGATIVE   Bilirubin Urine NEGATIVE NEGATIVE   Ketones, ur NEGATIVE NEGATIVE mg/dL   Protein, ur NEGATIVE NEGATIVE mg/dL   Nitrite NEGATIVE NEGATIVE   Leukocytes,Ua LARGE (A) NEGATIVE   RBC / HPF 0-5 0 - 5 RBC/hpf   WBC, UA >50 0 - 5 WBC/hpf   Bacteria, UA MANY (A) NONE SEEN   Squamous Epithelial / HPF 6-10 0 - 5 /HPF   Mucus PRESENT   Protein / creatinine ratio, urine     Status: Abnormal   Collection Time: 02/28/24 11:49 AM  Result Value Ref Range   Creatinine, Urine 53 mg/dL   Total Protein, Urine 10 mg/dL   Protein Creatinine Ratio 0.19 (H) 0.00 -  0.15 mg/mg[Cre]       Assessment and Plan   1. Itching   2. [redacted] weeks gestation of pregnancy    -Patient sent from FM to evaluate for preeclampsia & cholestasis. Patient has 3 day history of itching & intermittent RUQ pain. Itching is of hands &  soles of feet. CMP & bile acids ordered. LFTs  normal. Sent prescription for vistaril & ursodiol. Has appt with FM on Monday for f/u. If Bile acids elevated, recommend 37 wk IOL (may need BPP prior to IOL), If normal, can d/c ursodiol & continue care with FM. Dr. Drue Gerald aware of plan & recommendation -Preeclampsia labs ordered & normal. Patient normotensive in office & in MAU -Reviewed reasons to return to MAU  Terri Fester 02/28/2024, 11:40 AM

## 2024-02-28 NOTE — Progress Notes (Signed)
  [facility] Prenatal Visit  Molly Mendez [name] is a 23 y.o. G2P0010 at [redacted]w[redacted]d here for routine follow up. She is dated by early ultrasound.  She reports itching of palms and feet x3-4 days, intermittent daily sharp upper abdominal pain, increasing swelling of feet and hands, seeing spots x30 mins a few days ago . She reports fetal movement. She denies vaginal bleeding, contractions, or loss of fluid. No headache. See flow sheet for details.  Vitals:   02/28/24 0852 02/28/24 0911  BP: 130/85 121/88  Pulse: 89     A/P: Pregnancy at [redacted]w[redacted]d.  Doing well.   Routine prenatal care  Dating reviewed, dating tab is correct Fetal heart tones Appropriate Fundal height within expected range.  Fetal position confirmed Vertex using Ultrasound .  GBS collected today. .  Repeat GC/CT collected today.  The patient does not have a history of HSV and valacyclovir is not indicated at this time.  Infant feeding choice: Both  Contraception choice: barrier (condoms) Infant circumcision desired not applicable Tdap administered today.  BPP not yet scheduled, pending MAU eval.   Pregnancy education regarding preterm labor, fetal movement,  benefits of breastfeeding, contraception, fetal growth, expected weight gain, and safe infant sleep were discussed.    2. Pregnancy issues include the following and were addressed as appropriate today:   Itching of palms/feet: Discussed concern for cholestasis of pregnancy and recommend assessment/monitoring in MAU. Patient agreeable to be seen in MAU. MAU provider contacted.   Swelling of hands/feet: BP borderline today, recommend pre-e workup and evaluation in MAU.    Problem list and pregnancy box updated: Yes.   Follow up 1 week if discharged from MAU.

## 2024-02-28 NOTE — Patient Instructions (Addendum)
 We are sending you to the maternity assessment unit due to the itching on your palms and soles- we want you to be tested for intrahepatic cholestasis of pregnancy and you are close to term so you may need to stay and be induced to have your baby. They will also do monitoring to make sure your baby looks ok!  You got your Tdap shot today and we collected swabs for routine testing including group b strep. Your baby is in the head down position which is great!   Pregnancy Related Return Precautions The follow are signs/symptoms that are abnormal in pregnancy and may require further evaluation by a physician: Go to the MAU at [facility] if: You have cramping/contractions that do not go away with drinking water , especially if they are lasting 30 seconds to 1.5 minutes, coming and going every 5-10 minutes for an hour or more, or are getting stronger and you cannot walk or talk while having a contraction/cramp. Your water  breaks.  Sometimes it is a big gush of fluid, sometimes it is just a trickle that keeps getting your underwear wet or running down your legs You have vaginal bleeding.    You do not feel your baby moving like normal.  If you do not, get something to eat and drink (something cold or something with sugar like peanut butter or juice) and lay down and focus on feeling your baby move. If your baby is still not moving like normal, you should go to MAU. You should feel your baby move 6 times in one hour, or 10 times in two hours. You have a persistent headache that does not go away with 1 g of Tylenol , vision changes, chest pain, difficulty breathing, severe pain in your right upper abdomen, worsening leg swelling- these can all be signs of high blood pressure in pregnancy and need to be evaluated by a provider immediately  These are all concerning in pregnancy and if you have any of these I recommend you call your PCP and present to the Maternity Admissions Unit (map  below) for further evaluation.  For any pregnancy-related emergencies, please go to the Maternity Admissions Unit in the Women's & Children's Center at [facility]. You will use hospital Entrance C.    Our clinic number is [phone].   Dr Drue Gerald

## 2024-02-28 NOTE — Patient Instructions (Signed)
Please make a follow up appointment in 1 weeks.  Prenatal Classes Go to [email or web address] for more information on the pregnancy and child birth classes that [place] has to offer.   Pregnancy Related Return Precautions The follow are signs/symptoms that are abnormal in pregnancy and may require further evaluation by a physician: Go to the MAU at [facility] Center at [place] if: You have cramping/contractions that do not go away with drinking water, especially if they are lasting 30 seconds to 1.5 minutes, coming and going every 5-10 minutes for an hour or more, or are getting stronger and you cannot walk or talk while having a contraction/cramp. Your water breaks.  Sometimes it is a big gush of fluid, sometimes it is just a trickle that keeps getting your underwear wet or running down your legs You have vaginal bleeding.    You do not feel your baby moving like normal.  If you do not, get something to eat and drink (something cold or something with sugar like peanut butter or juice) and lay down and focus on feeling your baby move. If your baby is still not moving like normal, you should go to MAU. You should feel your baby move 6 times in one hour, or 10 times in two hours. You have a persistent headache that does not go away with 1 g of Tylenol, vision changes, chest pain, difficulty breathing, severe pain in your right upper abdomen, worsening leg swelling- these can all be signs of high blood pressure in pregnancy and need to be evaluated by a provider immediately  These are all concerning in pregnancy and if you have any of these I recommend you call your PCP and present to the Maternity Admissions Unit (map below) for further evaluation.  For any pregnancy-related emergencies, please go to the Maternity Admissions Unit in the Women's & Children's Center at [facility]. You will use hospital Entrance C.    Our clinic number is [phone].   Dr Joquan Lotz  

## 2024-02-29 ENCOUNTER — Ambulatory Visit: Payer: Self-pay | Admitting: Family Medicine

## 2024-02-29 LAB — CERVICOVAGINAL ANCILLARY ONLY
Chlamydia: NEGATIVE
Comment: NEGATIVE
Comment: NORMAL
Neisseria Gonorrhea: NEGATIVE

## 2024-02-29 LAB — BILE ACIDS, TOTAL: Bile Acids Total: 3 umol/L (ref 0.0–10.0)

## 2024-03-02 LAB — CULTURE, BETA STREP (GROUP B ONLY): Strep Gp B Culture: POSITIVE — AB

## 2024-03-03 ENCOUNTER — Ambulatory Visit: Payer: Self-pay | Admitting: Family Medicine

## 2024-03-03 ENCOUNTER — Encounter (HOSPITAL_COMMUNITY): Payer: Self-pay | Admitting: Obstetrics and Gynecology

## 2024-03-03 ENCOUNTER — Inpatient Hospital Stay (HOSPITAL_COMMUNITY)

## 2024-03-03 ENCOUNTER — Inpatient Hospital Stay (HOSPITAL_COMMUNITY)
Admission: AD | Admit: 2024-03-03 | Discharge: 2024-03-03 | Disposition: A | Discharge status: Home / Self Care | Payer: Medicaid Other | Attending: Obstetrics and Gynecology | Admitting: Obstetrics and Gynecology

## 2024-03-03 ENCOUNTER — Telehealth: Payer: Self-pay

## 2024-03-03 VITALS — BP 108/79 | HR 94 | Wt 216.4 lb

## 2024-03-03 DIAGNOSIS — O99213 Obesity complicating pregnancy, third trimester: Secondary | ICD-10-CM

## 2024-03-03 DIAGNOSIS — Z3A36 36 weeks gestation of pregnancy: Secondary | ICD-10-CM

## 2024-03-03 DIAGNOSIS — O26613 Liver and biliary tract disorders in pregnancy, third trimester: Secondary | ICD-10-CM | POA: Diagnosis not present

## 2024-03-03 DIAGNOSIS — K831 Obstruction of bile duct: Secondary | ICD-10-CM

## 2024-03-03 DIAGNOSIS — O26643 Intrahepatic cholestasis of pregnancy, third trimester: Secondary | ICD-10-CM

## 2024-03-03 DIAGNOSIS — F419 Anxiety disorder, unspecified: Secondary | ICD-10-CM | POA: Insufficient documentation

## 2024-03-03 DIAGNOSIS — O2693 Pregnancy related conditions, unspecified, third trimester: Secondary | ICD-10-CM | POA: Diagnosis not present

## 2024-03-03 DIAGNOSIS — O99343 Other mental disorders complicating pregnancy, third trimester: Secondary | ICD-10-CM | POA: Diagnosis not present

## 2024-03-03 DIAGNOSIS — L2989 Other pruritus: Secondary | ICD-10-CM | POA: Diagnosis present

## 2024-03-03 DIAGNOSIS — Z3403 Encounter for supervision of normal first pregnancy, third trimester: Secondary | ICD-10-CM

## 2024-03-03 DIAGNOSIS — Z3689 Encounter for other specified antenatal screening: Secondary | ICD-10-CM | POA: Diagnosis not present

## 2024-03-03 DIAGNOSIS — E669 Obesity, unspecified: Secondary | ICD-10-CM

## 2024-03-03 NOTE — Telephone Encounter (Signed)
 Called MFM while patient was in office to schedule a BPP. Was able to schedule for 03/06/2024 at 1pm Informed provider about the appointment date and time. Provider made sure patient was aware of upcoming BPP appointment.  Christ Courier, CMA

## 2024-03-03 NOTE — Patient Instructions (Addendum)
 Dear Molly Mendez  As discussed, you will be induced around 37 weeks for cholestasis of pregnancy.  The induction will be scheduled and the hospital will call you when it is time to come in.  You should go to the MAU to have a biophysical profile (BPP) done. I recommend you do this today - they are expecting you.  If you have any concerns before then, please call our clinic.  It was a pleasure to take care of you today. Be well!  Omar Bibber, DO [facility], PGY-1     Prenatal Classes Go to [email or web address] for more information on the pregnancy and child birth classes that [facility] has to offer.   Vaccinations If you are with the Adopt a Mom Program and need vaccines, these are done the [facility] Department on [address] [place]. The number to call to schedule an appt is [phone]  Pregnancy Related Return Precautions The follow are signs/symptoms that are abnormal in pregnancy and may require further evaluation by a physician: Go to the MAU at [facility] at [facility] if: You have cramping/contractions that do not go away with drinking water , especially if they are lasting 30 seconds to 1.5 minutes, coming and going every 5-10 minutes for an hour or more, or are getting stronger and you cannot walk or talk while having a contraction/cramp. Your water  breaks.  Sometimes it is a big gush of fluid, sometimes it is just a trickle that keeps getting your underwear wet or running down your legs You have vaginal bleeding.    You do not feel your baby moving like normal.  If you do not, get something to eat and drink (something cold or something with sugar like peanut butter or juice) and lay down and focus on feeling your baby move. If your baby is still not moving like normal, you should go to MAU. You should feel your baby move 6 times in one hour, or 10 times in two hours. You have a  persistent headache that does not go away with 1 g of Tylenol , vision changes, chest pain, difficulty breathing, severe pain in your right upper abdomen, worsening leg swelling- these can all be signs of high blood pressure in pregnancy and need to be evaluated by a provider immediately  These are all concerning in pregnancy and if you have any of these I recommend you call your PCP and present to the Maternity Admissions Unit (map below) for further evaluation.  For any pregnancy-related emergencies, please go to the Maternity Admissions Unit in the [facility] Center at [facility]. You will use hospital Entrance C.    Our clinic number is [phone].

## 2024-03-03 NOTE — MAU Provider Note (Signed)
 MAU Provider Note  Chief Complaint: for BPP and NST  SUBJECTIVE HPI: Molly Mendez is a 23 y.o. G2P0010 at [redacted]w[redacted]d by early ultrasound who presents to maternity admissions reporting need for BPP. Pregnancy c/b cholestasis. Receives [facility].  Patient developed itching on palms/soles last week. Bile acids normal, clinically diagnosed with cholestasis. Unable to get BPP OP; sent from office to get BPP done. Denies VB, LOF, DFM, contractions.   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   PCOS (polycystic ovarian syndrome)    UTI (urinary tract infection) 02/25/2020   Past Surgical History:  Procedure Laterality Date   EYE MUSCLE SURGERY Right x2   as a child   MEDIAN RECTUS REPAIR Right 05/06/2014   Procedure: MEDIAN RECTUS RESECTION;  Surgeon: Hoyle Maclachlan, MD;  Location: [facility];  Service: Ophthalmology;  Laterality: Right;   Social History   Socioeconomic History   Marital status: Married    Spouse name: Not on file   Number of children: Not on file   Years of education: Not on file   Highest education level: Not on file  Occupational History   Not on file  Tobacco Use   Smoking status: Former    Types: E-cigarettes    Passive exposure: Never   Smokeless tobacco: Never  Vaping Use   Vaping status: Former   Substances: Flavoring  Substance and Sexual Activity   Alcohol use: No   Drug use: Never   Sexual activity: Yes  Other Topics Concern   Not on file  Social History Narrative   Not on file   Social Drivers of Health   Financial Resource Strain: Not on file  Food Insecurity: Not on file  Transportation Needs: Not on file  Physical Activity: Not on file  Stress: Not on file  Social Connections: Not on file  Intimate Partner Violence: Not on file   No current facility-administered medications on  file prior to encounter.   No current outpatient medications on file prior to encounter.   Allergies  Allergen Reactions   Ibuprofen Other (See Comments)    Burning feeling in stomach   Pork-Derived Products     ROS:  Pertinent positives/negatives listed above.  I have reviewed patient's Past Medical Hx, Surgical Hx, Family Hx, Social Hx, medications and allergies.   Physical Exam  Patient Vitals for the past 24 hrs:  BP Temp Temp src Pulse Resp SpO2 Height Weight  03/03/24 1405 119/85 -- -- (!) 101 -- 98 % -- --  03/03/24 1352 118/78 98.3 F (36.8 C) Oral 93 17 100 % 5\' 3"  (1.6 m) 98.3 kg   Constitutional: Well-developed, well-nourished female in no acute distress  Cardiovascular: normal rate Respiratory: normal effort GI: Abd soft, non-tender MS: Extremities nontender, no edema, normal ROM Neurologic: Alert and oriented x 4  GU: Neg CVAT.  PELVIC EXAM: Cervix pink, visually closed, without lesion, scant white creamy discharge, vaginal walls and external genitalia normal Bimanual exam: Cervix 0/long/high, firm, anterior, neg CMT, uterus nontender, nonenlarged, adnexa without tenderness, enlargement, or mass  FHT:  Baseline 130 , moderate variability, accelerations present, no decelerations Contractions: none  LAB RESULTS No results found for this or any previous visit (from the past 24 hours).  A/Positive/-- (11/07 1419)  IMAGING No results found.  MAU Management/MDM: Orders Placed This Encounter  Procedures  US  MFM FETAL BPP WO NON STRESS   Discharge patient    No orders of the defined types were placed in this encounter.    Available prenatal records reviewed.  Presents for NST/BPP in setting of cholestasis of pregnancy. BPP 10/10. Has induction scheduled 6/11. Reassuring maternal and fetal status at this time.  ASSESSMENT 1. NST (non-stress test) reactive   2. Cholestasis during pregnancy in third trimester   3. [redacted] weeks gestation of pregnancy      PLAN Discharge home with strict return precautions. Allergies as of 03/03/2024       Reactions   Ibuprofen Other (See Comments)   Burning feeling in stomach   Pork-derived Products         Medication List     TAKE these medications    aspirin EC 81 MG tablet Take 81 mg by mouth daily. Swallow whole.   hydrOXYzine  25 MG capsule Commonly known as: Vistaril  Take 1 capsule (25 mg total) by mouth every 8 (eight) hours as needed for itching.   prenatal multivitamin Tabs tablet Take 1 tablet by mouth daily at 12 noon.   ursodiol  300 MG capsule Commonly known as: ACTIGALL  Take 1 capsule (300 mg total) by mouth 2 (two) times daily for 7 days.         Authur Leghorn, MD OB Fellow 03/03/2024  2:55 PM

## 2024-03-03 NOTE — Progress Notes (Signed)
  [facility] Prenatal Visit  Molly Mendez Molly Mendez is a 23 y.o. G2P0010 at [redacted]w[redacted]d here for routine follow up. She is dated by early ultrasound.  She reports no complaints. She reports fetal movement. She denies vaginal bleeding, contractions, or loss of fluid. See flow sheet for details.  Still having some itching of palms and forearms, soles (mainly at night), ursodiol  from MAU has helped - Taking BID. Also using hydroxyzine  25 mg PRN from MAU, though does not take often as it makes her sleepy.  Vitals:   03/03/24 1100  BP: 108/79  Pulse: 94    A/P: Pregnancy at [redacted]w[redacted]d.  Doing well.   Routine prenatal care  Dating reviewed, dating tab is correct Fetal heart tones Appropriate Fundal height within expected range.  The patient does not have a history of HSV and valacyclovir is not indicated at this time.  Infant feeding choice: Both  Contraception choice: Barrier (condoms) Influenza vaccine previously administered.   Tdap previously administered between 27-36 weeks  Pregnancy education regarding preterm labor, fetal movement,  benefits of breastfeeding, contraception, fetal growth, expected weight gain, and safe infant sleep were discussed.    2. Pregnancy issues include the following and were addressed as appropriate today:   GBS POSITIVE - discussed with patient, answered all questions.  Cholestasis of Pregnancy - Discussed with Dr. Vallarie Gauze who recommends induction at 37 weeks. Pt sent to MAU for BPP. Induction scheduled 03/05/24.  Problem list and pregnancy box updated: Yes.   Omar Bibber, DO [facility], PGY-1 03/03/24 12:07 PM

## 2024-03-03 NOTE — MAU Note (Signed)
 Molly Mendez is a 23 y.o. at [redacted]w[redacted]d here in MAU reporting: doing ok, except for itching, didn't take meds before appt this morning had planned to get breakfast and take meds when she got home.   Denies pain, bleeding or LOF. Sent from office for BPP and NST, they were unable to scheduled.   Pain score: none Vitals:   03/03/24 1352  BP: 118/78  Pulse: 93  Resp: 17  Temp: 98.3 F (36.8 C)  SpO2: 100%     WGN:FAOZHY due to clothing, reports +FM Lab orders placed from triage:

## 2024-03-04 ENCOUNTER — Inpatient Hospital Stay (HOSPITAL_COMMUNITY)
Admission: RE | Admit: 2024-03-04 | Discharge: 2024-03-08 | DRG: 807 | Disposition: A | Discharge status: Home / Self Care | Attending: Obstetrics and Gynecology | Admitting: Obstetrics and Gynecology

## 2024-03-04 ENCOUNTER — Telehealth (HOSPITAL_COMMUNITY): Payer: Self-pay | Admitting: *Deleted

## 2024-03-04 ENCOUNTER — Encounter: Payer: Self-pay | Admitting: *Deleted

## 2024-03-04 DIAGNOSIS — Z7982 Long term (current) use of aspirin: Secondary | ICD-10-CM | POA: Diagnosis not present

## 2024-03-04 DIAGNOSIS — Z30011 Encounter for initial prescription of contraceptive pills: Secondary | ICD-10-CM | POA: Diagnosis present

## 2024-03-04 DIAGNOSIS — Z3A37 37 weeks gestation of pregnancy: Secondary | ICD-10-CM

## 2024-03-04 DIAGNOSIS — R339 Retention of urine, unspecified: Secondary | ICD-10-CM | POA: Diagnosis not present

## 2024-03-04 DIAGNOSIS — O99344 Other mental disorders complicating childbirth: Secondary | ICD-10-CM | POA: Diagnosis not present

## 2024-03-04 DIAGNOSIS — O99214 Obesity complicating childbirth: Secondary | ICD-10-CM | POA: Diagnosis present

## 2024-03-04 DIAGNOSIS — O2662 Liver and biliary tract disorders in childbirth: Secondary | ICD-10-CM | POA: Diagnosis not present

## 2024-03-04 DIAGNOSIS — O99210 Obesity complicating pregnancy, unspecified trimester: Secondary | ICD-10-CM | POA: Diagnosis present

## 2024-03-04 DIAGNOSIS — O26643 Intrahepatic cholestasis of pregnancy, third trimester: Principal | ICD-10-CM | POA: Diagnosis present

## 2024-03-04 DIAGNOSIS — O99824 Streptococcus B carrier state complicating childbirth: Secondary | ICD-10-CM | POA: Diagnosis present

## 2024-03-04 DIAGNOSIS — O99820 Streptococcus B carrier state complicating pregnancy: Secondary | ICD-10-CM | POA: Diagnosis not present

## 2024-03-04 DIAGNOSIS — Z87891 Personal history of nicotine dependence: Secondary | ICD-10-CM | POA: Diagnosis not present

## 2024-03-04 DIAGNOSIS — Z3490 Encounter for supervision of normal pregnancy, unspecified, unspecified trimester: Secondary | ICD-10-CM | POA: Diagnosis present

## 2024-03-04 DIAGNOSIS — O26649 Intrahepatic cholestasis of pregnancy, unspecified trimester: Secondary | ICD-10-CM | POA: Diagnosis present

## 2024-03-04 NOTE — Telephone Encounter (Signed)
 Preadmission screen

## 2024-03-05 ENCOUNTER — Other Ambulatory Visit: Payer: Self-pay | Admitting: Advanced Practice Midwife

## 2024-03-05 ENCOUNTER — Inpatient Hospital Stay (HOSPITAL_COMMUNITY): Admitting: Anesthesiology

## 2024-03-05 ENCOUNTER — Other Ambulatory Visit: Payer: Self-pay

## 2024-03-05 ENCOUNTER — Inpatient Hospital Stay (HOSPITAL_COMMUNITY)

## 2024-03-05 DIAGNOSIS — O26649 Intrahepatic cholestasis of pregnancy, unspecified trimester: Secondary | ICD-10-CM | POA: Diagnosis present

## 2024-03-05 DIAGNOSIS — Z3490 Encounter for supervision of normal pregnancy, unspecified, unspecified trimester: Secondary | ICD-10-CM | POA: Diagnosis present

## 2024-03-05 DIAGNOSIS — Z30011 Encounter for initial prescription of contraceptive pills: Secondary | ICD-10-CM | POA: Diagnosis present

## 2024-03-05 LAB — TYPE AND SCREEN
ABO/RH(D): A POS
Antibody Screen: NEGATIVE

## 2024-03-05 LAB — RPR: RPR Ser Ql: NONREACTIVE

## 2024-03-05 LAB — CBC
HCT: 37.7 % (ref 36.0–46.0)
Hemoglobin: 12.7 g/dL (ref 12.0–15.0)
MCH: 27.9 pg (ref 26.0–34.0)
MCHC: 33.7 g/dL (ref 30.0–36.0)
MCV: 82.9 fL (ref 80.0–100.0)
Platelets: 237 10*3/uL (ref 150–400)
RBC: 4.55 MIL/uL (ref 3.87–5.11)
RDW: 13.1 % (ref 11.5–15.5)
WBC: 11.4 10*3/uL — ABNORMAL HIGH (ref 4.0–10.5)
nRBC: 0 % (ref 0.0–0.2)

## 2024-03-05 LAB — COMPREHENSIVE METABOLIC PANEL WITH GFR
ALT: 14 U/L (ref 0–44)
AST: 20 U/L (ref 15–41)
Albumin: 2.8 g/dL — ABNORMAL LOW (ref 3.5–5.0)
Alkaline Phosphatase: 175 U/L — ABNORMAL HIGH (ref 38–126)
Anion gap: 9 (ref 5–15)
BUN: 8 mg/dL (ref 6–20)
CO2: 19 mmol/L — ABNORMAL LOW (ref 22–32)
Calcium: 8.9 mg/dL (ref 8.9–10.3)
Chloride: 106 mmol/L (ref 98–111)
Creatinine, Ser: 0.71 mg/dL (ref 0.44–1.00)
GFR, Estimated: >60 mL/min (ref >60)
Glucose, Bld: 118 mg/dL — ABNORMAL HIGH (ref 70–99)
Potassium: 3.7 mmol/L (ref 3.5–5.1)
Sodium: 134 mmol/L — ABNORMAL LOW (ref 135–145)
Total Bilirubin: 0.4 mg/dL (ref 0.0–1.2)
Total Protein: 6.5 g/dL (ref 6.5–8.1)

## 2024-03-05 MED ORDER — SODIUM CHLORIDE 0.9 % IV SOLN
5.0000 10*6.[IU] | Freq: Once | INTRAVENOUS | Status: AC
  Administered 2024-03-05: 5 10*6.[IU] via INTRAVENOUS
  Filled 2024-03-05: qty 5

## 2024-03-05 MED ORDER — EPHEDRINE 5 MG/ML INJ: 10.0000 mg | INTRAVENOUS | Status: DC | PRN

## 2024-03-05 MED ORDER — OXYCODONE-ACETAMINOPHEN 5-325 MG PO TABS: 2.0000 | ORAL_TABLET | ORAL | Status: DC | PRN

## 2024-03-05 MED ORDER — OXYTOCIN-SODIUM CHLORIDE 30-0.9 UT/500ML-% IV SOLN
1.0000 m[IU]/min | INTRAVENOUS | Status: DC
  Administered 2024-03-05: 1 m[IU]/min via INTRAVENOUS
  Filled 2024-03-05: qty 500

## 2024-03-05 MED ORDER — TERBUTALINE SULFATE 1 MG/ML IJ SOLN: 0.2500 mg | Freq: Once | INTRAMUSCULAR | Status: DC | PRN

## 2024-03-05 MED ORDER — LACTATED RINGERS IV SOLN: INTRAVENOUS | Status: DC

## 2024-03-05 MED ORDER — LACTATED RINGERS IV SOLN
500.0000 mL | Freq: Once | INTRAVENOUS | Status: DC
Start: 2024-03-05 — End: 2024-03-05

## 2024-03-05 MED ORDER — LIDOCAINE HCL (PF) 1 % IJ SOLN: 30.0000 mL | INTRAMUSCULAR | Status: DC | PRN

## 2024-03-05 MED ORDER — LIDOCAINE HCL (PF) 1 % IJ SOLN
INTRAMUSCULAR | Status: DC | PRN
Start: 2024-03-05 — End: 2024-03-06
  Administered 2024-03-05: 8 mL via EPIDURAL

## 2024-03-05 MED ORDER — URSODIOL 300 MG PO CAPS
300.0000 mg | ORAL_CAPSULE | Freq: Two times a day (BID) | ORAL | Status: DC
  Administered 2024-03-05 (×2): 300 mg via ORAL
  Filled 2024-03-05 (×3): qty 1

## 2024-03-05 MED ORDER — FENTANYL CITRATE (PF) 100 MCG/2ML IJ SOLN: 50.0000 ug | INTRAMUSCULAR | Status: DC | PRN

## 2024-03-05 MED ORDER — OXYCODONE-ACETAMINOPHEN 5-325 MG PO TABS: 1.0000 | ORAL_TABLET | ORAL | Status: DC | PRN

## 2024-03-05 MED ORDER — PENICILLIN G POT IN DEXTROSE 60000 UNIT/ML IV SOLN
3.0000 10*6.[IU] | INTRAVENOUS | Status: DC
  Administered 2024-03-05 (×5): 3 10*6.[IU] via INTRAVENOUS
  Filled 2024-03-05 (×5): qty 50

## 2024-03-05 MED ORDER — ACETAMINOPHEN 325 MG PO TABS: 650.0000 mg | ORAL_TABLET | ORAL | Status: DC | PRN

## 2024-03-05 MED ORDER — OXYTOCIN-SODIUM CHLORIDE 30-0.9 UT/500ML-% IV SOLN: 2.5000 [IU]/h | INTRAVENOUS | Status: DC

## 2024-03-05 MED ORDER — OXYTOCIN BOLUS FROM INFUSION
333.0000 mL | Freq: Once | INTRAVENOUS | Status: AC
Start: 2024-03-05 — End: 2024-03-06
  Administered 2024-03-06: 333 mL via INTRAVENOUS

## 2024-03-05 MED ORDER — PHENYLEPHRINE 80 MCG/ML (10ML) SYRINGE FOR IV PUSH (FOR BLOOD PRESSURE SUPPORT): 80.0000 ug | PREFILLED_SYRINGE | INTRAVENOUS | Status: DC | PRN

## 2024-03-05 MED ORDER — ONDANSETRON HCL 4 MG/2ML IJ SOLN
4.0000 mg | Freq: Four times a day (QID) | INTRAMUSCULAR | Status: DC | PRN
  Administered 2024-03-05: 4 mg via INTRAVENOUS
  Filled 2024-03-05: qty 2

## 2024-03-05 MED ORDER — FENTANYL-BUPIVACAINE-NACL 0.5-0.125-0.9 MG/250ML-% EP SOLN
12.0000 mL/h | EPIDURAL | Status: DC | PRN
  Administered 2024-03-05: 12 mL/h via EPIDURAL
  Filled 2024-03-05: qty 250

## 2024-03-05 MED ORDER — DIPHENHYDRAMINE HCL 50 MG/ML IJ SOLN
12.5000 mg | INTRAMUSCULAR | Status: DC | PRN
Start: 2024-03-05 — End: 2024-03-05

## 2024-03-05 MED ORDER — URSODIOL 300 MG PO CAPS: 300.0000 mg | ORAL_CAPSULE | Freq: Two times a day (BID) | ORAL | Status: DC

## 2024-03-05 MED ORDER — MISOPROSTOL 25 MCG QUARTER TABLET
25.0000 ug | ORAL_TABLET | Freq: Once | ORAL | Status: AC
  Administered 2024-03-05: 25 ug via VAGINAL
  Filled 2024-03-05: qty 1

## 2024-03-05 MED ORDER — LACTATED RINGERS IV SOLN
500.0000 mL | INTRAVENOUS | Status: DC | PRN
  Administered 2024-03-05 (×3): 500 mL via INTRAVENOUS

## 2024-03-05 MED ORDER — HYDROXYZINE HCL 25 MG PO TABS
25.0000 mg | ORAL_TABLET | Freq: Three times a day (TID) | ORAL | Status: DC | PRN
  Administered 2024-03-05: 25 mg via ORAL
  Filled 2024-03-05 (×2): qty 1

## 2024-03-05 MED ORDER — PHENYLEPHRINE 80 MCG/ML (10ML) SYRINGE FOR IV PUSH (FOR BLOOD PRESSURE SUPPORT)
80.0000 ug | PREFILLED_SYRINGE | INTRAVENOUS | Status: DC | PRN
  Administered 2024-03-05: 80 ug via INTRAVENOUS
  Filled 2024-03-05 (×2): qty 10

## 2024-03-05 MED ORDER — SOD CITRATE-CITRIC ACID 500-334 MG/5ML PO SOLN: 30.0000 mL | ORAL | Status: DC | PRN

## 2024-03-05 MED ORDER — DIPHENHYDRAMINE HCL 50 MG/ML IJ SOLN: 12.5000 mg | INTRAMUSCULAR | Status: DC | PRN

## 2024-03-05 MED ORDER — MISOPROSTOL 50MCG HALF TABLET
50.0000 ug | ORAL_TABLET | Freq: Once | ORAL | Status: AC
  Administered 2024-03-05: 50 ug via BUCCAL
  Filled 2024-03-05: qty 1

## 2024-03-05 MED ORDER — MISOPROSTOL 50MCG HALF TABLET
50.0000 ug | ORAL_TABLET | Freq: Once | ORAL | Status: AC
Start: 2024-03-05 — End: 2024-03-05
  Administered 2024-03-05: 50 ug via ORAL
  Filled 2024-03-05: qty 1

## 2024-03-05 MED ORDER — LACTATED RINGERS IV SOLN: 500.0000 mL | Freq: Once | INTRAVENOUS | Status: DC

## 2024-03-05 MED ORDER — FENTANYL-BUPIVACAINE-NACL 0.5-0.125-0.9 MG/250ML-% EP SOLN: 12.0000 mL/h | EPIDURAL | Status: DC | PRN

## 2024-03-05 NOTE — Progress Notes (Signed)
 23 yo g1 @ 37+0, iol for cholestasis. Unfavorable cervix on arrival, has received cytotec x2. Now 1.5 cm dilated and posterior. Cat 1 tracing. Attempted foley manually but unsuccessful and patient didn't tolerate well. Attempted with speculum and ring forceps, this was successful. Contracting too frequently to re-dose with cytotec, will maintain foley and re-dose with cytotec if contractions space apart.

## 2024-03-05 NOTE — Progress Notes (Signed)
 Nipple stim with hand breast pump, 5 minutes per breast.  Permission from MD.

## 2024-03-05 NOTE — Anesthesia Preprocedure Evaluation (Addendum)
 Anesthesia Evaluation  Patient identified by MRN, date of birth, ID band Patient awake    Reviewed: Allergy & Precautions, H&P , NPO status , Patient's Chart, lab work & pertinent test results, reviewed documented beta blocker date and time   Airway Mallampati: II  TM Distance: >3 FB Neck ROM: full    Dental no notable dental hx. (+) Teeth Intact, Dental Advisory Given   Pulmonary former smoker   Pulmonary exam normal breath sounds clear to auscultation       Cardiovascular negative cardio ROS Normal cardiovascular exam Rhythm:regular Rate:Normal     Neuro/Psych  PSYCHIATRIC DISORDERS Anxiety Depression    negative neurological ROS  negative psych ROS   GI/Hepatic negative GI ROS, Neg liver ROS,,,  Endo/Other  negative endocrine ROS    Renal/GU negative Renal ROS  negative genitourinary   Musculoskeletal   Abdominal   Peds  Hematology negative hematology ROS (+)   Anesthesia Other Findings Cholelithiasis   Reproductive/Obstetrics (+) Pregnancy                             Anesthesia Physical Anesthesia Plan  ASA: 3  Anesthesia Plan: Epidural   Post-op Pain Management: Epidural* and Minimal or no pain anticipated   Induction: Intravenous  PONV Risk Score and Plan: 2 and Treatment may vary due to age or medical condition  Airway Management Planned: Natural Airway and Simple Face Mask  Additional Equipment: None  Intra-op Plan:   Post-operative Plan:   Informed Consent: I have reviewed the patients History and Physical, chart, labs and discussed the procedure including the risks, benefits and alternatives for the proposed anesthesia with the patient or authorized representative who has indicated his/her understanding and acceptance.     Dental Advisory Given  Plan Discussed with: Anesthesiologist and CRNA  Anesthesia Plan Comments: (Labs checked- platelets confirmed with RN  in room. Fetal heart tracing, per RN, reported to be stable enough for sitting procedure. Discussed epidural, and patient consents to the procedure:  included risk of possible headache,backache, failed block, allergic reaction, and nerve injury. This patient was asked if she had any questions or concerns before the procedure started.)       Anesthesia Quick Evaluation

## 2024-03-05 NOTE — H&P (Signed)
 OBSTETRIC ADMISSION HISTORY AND PHYSICAL  Molly Mendez is a 23 y.o. female G2P0010 with IUP at [redacted]w[redacted]d (dated by 6wk US , Estimated Date of Delivery: 03/26/24) presenting for induction of labor for clinical cholestasis of pregnancy.   She reports +FMs, No LOF, no VB, no blurry vision, headaches or peripheral edema, and RUQ pain.    She plans on breast and formula feeding. She plans on using condoms for birth control.  She received her prenatal care at [facility]   Prenatal History/Complications:  - Cholestasis of pregnancy -- clinical diagnosis, bile acids 3.0  - GAD/MDD  Past Medical History: Past Medical History:  Diagnosis Date   ADHD (attention deficit hyperactivity disorder)    Anxiety    Depression    Exotropia, intermittent, monocular 05/05/2014   PCOS (polycystic ovarian syndrome)    UTI (urinary tract infection) 02/25/2020    Past Surgical History: Past Surgical History:  Procedure Laterality Date   EYE MUSCLE SURGERY Right x2   as a child   MEDIAN RECTUS REPAIR Right 05/06/2014   Procedure: MEDIAN RECTUS RESECTION;  Surgeon: Hoyle Maclachlan, MD;  Location: [facility];  Service: Ophthalmology;  Laterality: Right;    Obstetrical History: OB History     Gravida  2   Para      Term      Preterm      AB  1   Living  0      SAB  1   IAB      Ectopic      Multiple      Live Births              Social History Social History   Socioeconomic History   Marital status: Married    Spouse name: Not on file   Number of children: Not on file   Years of education: Not on file   Highest education level: Not on file  Occupational History   Not on file  Tobacco Use   Smoking status: Former    Types: E-cigarettes    Passive exposure: Never   Smokeless tobacco: Never  Vaping Use   Vaping status: Former   Substances: Engineer, production  Substance and Sexual Activity   Alcohol use: No   Drug use: Never   Sexual activity: Yes  Other  Topics Concern   Not on file  Social History Narrative   Not on file   Social Drivers of Health   Financial Resource Strain: Not on file  Food Insecurity: Not on file  Transportation Needs: Not on file  Physical Activity: Not on file  Stress: Not on file  Social Connections: Not on file    Family History: Family History  Problem Relation Age of Onset   Anxiety disorder Mother    High Cholesterol Mother    High blood pressure Mother    Heart Problems Mother    Anxiety disorder Sister    Depression Sister    Anxiety disorder Brother    Depression Brother    High blood pressure Maternal Grandmother    High blood pressure Maternal Grandfather    High blood pressure Paternal Grandmother    High blood pressure Paternal Grandfather     Allergies: Allergies  Allergen Reactions   Ibuprofen Other (See Comments)    Burning feeling in stomach   Pork-Derived Products     Medications Prior to Admission  Medication Sig Dispense Refill Last Dose/Taking   aspirin EC 81 MG tablet Take  81 mg by mouth daily. Swallow whole.   03/05/2024 Morning   hydrOXYzine  (VISTARIL ) 25 MG capsule Take 1 capsule (25 mg total) by mouth every 8 (eight) hours as needed for itching. 30 capsule 0 03/04/2024   Prenatal Vit-Fe Fumarate-FA (PRENATAL MULTIVITAMIN) TABS tablet Take 1 tablet by mouth daily at 12 noon.   03/05/2024 Morning   ursodiol  (ACTIGALL ) 300 MG capsule Take 1 capsule (300 mg total) by mouth 2 (two) times daily for 7 days. 14 capsule 0 03/05/2024 Morning     Review of Systems  All systems reviewed and negative except as stated in HPI.  Last menstrual period 06/09/2023. General appearance: alert and cooperative Lungs: breathing comfortably on room air Heart: regular rate Abdomen: soft, non-tender; gravid Extremities: no edema of bilateral lower extremities Presentation: cephalic Fetal monitoring: 140/mod/+a/-d Uterine activity: Irritability     Prenatal labs: ABO, Rh: A/Positive/--  (11/07 1419) Antibody: Negative (11/07 1419) Rubella: 3.34 (11/07 1419) RPR: Non Reactive (04/17 0905)  HBsAg: Negative (11/07 1419)  HIV: Non Reactive (04/17 0905)  GBS: Positive/-- (06/05 1620)  1 hr Glucola wnl Genetic screening low risk, female Anatomy US  wnl Last US : At [redacted]w[redacted]d - cephalic presentation, EFW 20 %tile  Prenatal Transfer Tool  Maternal Diabetes: No Genetic Screening: Normal Maternal Ultrasounds/Referrals: Normal Fetal Ultrasounds or other Referrals:  None Maternal Substance Abuse:  No Significant Maternal Medications:  Meds include: Other: Ursodiol  Significant Maternal Lab Results:  Group B Strep positive Number of Prenatal Visits:greater than 3 verified prenatal visits Other Comments:  None  No results found for this or any previous visit (from the past 24 hours).  Patient Active Problem List   Diagnosis Date Noted   Encounter for induction of labor 03/05/2024   Itching of both hands 02/28/2024   Obesity affecting pregnancy, antepartum 12/28/2023   Spine pain- history of ? slipped disc 12/07/2023   Vaginal discharge 08/09/2023   Pregnancy 12/08/2022   Depression, major, single episode, mild (HCC) 08/25/2020   Anxiety and depression 07/13/2019   History of PCOS 07/13/2019    Assessment/Plan:  Molly Mendez is a 23 y.o. G2P0010 at [redacted]w[redacted]d here for IOL due to cholestasis of pregnancy  #Labor: Discussed process of IOL in detail w pt. Cx is closed/thick. Will begin induction of labor with dual Cytotec. #Pain: Desires nitrous #FWB: Cat I #ID:  GBS positive, on PCN #MOF: Both #MOC: Condoms #Circ:  N/A  #Cholestasis of pregnancy: Cont Ursodiol , last BA 3.0, LFTs pending, but last were wnl  Melanie Spires, MD OB Fellow, Faculty Practice [facility], Center for [facility] 03/05/2024 12:44 AM

## 2024-03-05 NOTE — Anesthesia Procedure Notes (Signed)
 Epidural Patient location during procedure: OB Start time: 03/05/2024 7:02 PM End time: 03/05/2024 7:05 PM  Staffing Anesthesiologist: Rhenda Cedars, MD Performed: other anesthesia staff   Preanesthetic Checklist Completed: patient identified, IV checked, site marked, risks and benefits discussed, surgical consent, monitors and equipment checked, pre-op evaluation and timeout performed  Epidural Patient position: sitting Prep: DuraPrep and site prepped and draped Patient monitoring: continuous pulse ox and blood pressure Approach: midline Location: L3-L4 Injection technique: LOR air  Needle:  Needle type: Tuohy  Needle gauge: 17 G Needle length: 9 cm and 9 Needle insertion depth: 8 cm Catheter type: closed end flexible Catheter size: 19 Gauge Catheter at skin depth: 14 cm Test dose: negative  Assessment Events: blood not aspirated, no cerebrospinal fluid, injection not painful, no injection resistance, no paresthesia and negative IV test

## 2024-03-05 NOTE — Plan of Care (Signed)

## 2024-03-05 NOTE — Progress Notes (Signed)
 Labor progress note:  At bedside to meet patient. Patient comfortable with epidural, sitting up, family at bedside. No questions at this time. Cat I tracing, having early decels. Contractions regular, on 10 of Pitocin. Anticipate cervical change given contraction pattern and EFM pattern.  Melanie Spires, MD OB Fellow, Faculty Practice [facility], Center for [facility]

## 2024-03-05 NOTE — Progress Notes (Signed)
 23 yo g1 @ 37+0, iol for cholestasis. S/p cytotec and foley. Has been on pitocin for about 4 hours now, 4/60/-3, vertex well applied, agreeable to amniotomy, risks discussed including risk of cord prolapse and need for emergency cesarean, verbally consents to amniotomy which was performed, clear fluid, cat 1 tracing before and after. Will continue to up-titrate pitocin.

## 2024-03-05 NOTE — Progress Notes (Signed)
 23 yo g1 @ 37+0, iol for cholestasis. Unfavorable cervix on arrival, has received cytotec x2 and Foley. Foley out, now 3.5 cm dilated, cat 1 tracing, will start pitocin, plan AROM when vertex well applied.

## 2024-03-06 ENCOUNTER — Ambulatory Visit

## 2024-03-06 ENCOUNTER — Encounter (HOSPITAL_COMMUNITY): Payer: Self-pay | Admitting: Obstetrics and Gynecology

## 2024-03-06 ENCOUNTER — Other Ambulatory Visit

## 2024-03-06 DIAGNOSIS — O99344 Other mental disorders complicating childbirth: Secondary | ICD-10-CM

## 2024-03-06 DIAGNOSIS — O99820 Streptococcus B carrier state complicating pregnancy: Secondary | ICD-10-CM

## 2024-03-06 DIAGNOSIS — O2662 Liver and biliary tract disorders in childbirth: Secondary | ICD-10-CM

## 2024-03-06 DIAGNOSIS — Z3A37 37 weeks gestation of pregnancy: Secondary | ICD-10-CM

## 2024-03-06 DIAGNOSIS — O99214 Obesity complicating childbirth: Secondary | ICD-10-CM

## 2024-03-06 LAB — COMPREHENSIVE METABOLIC PANEL WITH GFR
ALT: 15 U/L (ref 0–44)
AST: 25 U/L (ref 15–41)
Albumin: 2.2 g/dL — ABNORMAL LOW (ref 3.5–5.0)
Alkaline Phosphatase: 159 U/L — ABNORMAL HIGH (ref 38–126)
Anion gap: 9 (ref 5–15)
BUN: <5 mg/dL — ABNORMAL LOW (ref 6–20)
CO2: 18 mmol/L — ABNORMAL LOW (ref 22–32)
Calcium: 8.8 mg/dL — ABNORMAL LOW (ref 8.9–10.3)
Chloride: 111 mmol/L (ref 98–111)
Creatinine, Ser: 0.74 mg/dL (ref 0.44–1.00)
GFR, Estimated: >60 mL/min (ref >60)
Glucose, Bld: 80 mg/dL (ref 70–99)
Potassium: 4 mmol/L (ref 3.5–5.1)
Sodium: 138 mmol/L (ref 135–145)
Total Bilirubin: 0.8 mg/dL (ref 0.0–1.2)
Total Protein: 5.5 g/dL — ABNORMAL LOW (ref 6.5–8.1)

## 2024-03-06 LAB — CBC WITH DIFFERENTIAL/PLATELET
Abs Immature Granulocytes: 0.09 10*3/uL — ABNORMAL HIGH (ref 0.00–0.07)
Basophils Absolute: 0 10*3/uL (ref 0.0–0.1)
Basophils Relative: 0 %
Eosinophils Absolute: 0.1 10*3/uL (ref 0.0–0.5)
Eosinophils Relative: 0 %
HCT: 36.6 % (ref 36.0–46.0)
Hemoglobin: 12.5 g/dL (ref 12.0–15.0)
Immature Granulocytes: 1 %
Lymphocytes Relative: 8 %
Lymphs Abs: 1.1 10*3/uL (ref 0.7–4.0)
MCH: 28.4 pg (ref 26.0–34.0)
MCHC: 34.2 g/dL (ref 30.0–36.0)
MCV: 83.2 fL (ref 80.0–100.0)
Monocytes Absolute: 1.3 10*3/uL — ABNORMAL HIGH (ref 0.1–1.0)
Monocytes Relative: 9 %
Neutro Abs: 11.6 10*3/uL — ABNORMAL HIGH (ref 1.7–7.7)
Neutrophils Relative %: 82 %
Platelets: 204 10*3/uL (ref 150–400)
RBC: 4.4 MIL/uL (ref 3.87–5.11)
RDW: 13 % (ref 11.5–15.5)
WBC: 14.2 10*3/uL — ABNORMAL HIGH (ref 4.0–10.5)
nRBC: 0 % (ref 0.0–0.2)

## 2024-03-06 MED ORDER — DIPHENHYDRAMINE HCL 25 MG PO CAPS: 25.0000 mg | ORAL_CAPSULE | Freq: Four times a day (QID) | ORAL | Status: DC | PRN

## 2024-03-06 MED ORDER — ONDANSETRON HCL 4 MG/2ML IJ SOLN: 4.0000 mg | INTRAMUSCULAR | Status: DC | PRN

## 2024-03-06 MED ORDER — DIBUCAINE (PERIANAL) 1 % EX OINT: 1.0000 | TOPICAL_OINTMENT | CUTANEOUS | Status: DC | PRN

## 2024-03-06 MED ORDER — PRENATAL MULTIVITAMIN CH
1.0000 | ORAL_TABLET | Freq: Every day | ORAL | Status: DC
  Administered 2024-03-06: 1 via ORAL
  Filled 2024-03-06 (×2): qty 1

## 2024-03-06 MED ORDER — SENNOSIDES-DOCUSATE SODIUM 8.6-50 MG PO TABS
2.0000 | ORAL_TABLET | ORAL | Status: DC
Start: 2024-03-06 — End: 2024-03-08
  Administered 2024-03-06 – 2024-03-07 (×2): 2 via ORAL
  Filled 2024-03-06 (×2): qty 2

## 2024-03-06 MED ORDER — ONDANSETRON HCL 4 MG PO TABS: 4.0000 mg | ORAL_TABLET | ORAL | Status: DC | PRN

## 2024-03-06 MED ORDER — ACETAMINOPHEN 325 MG PO TABS
650.0000 mg | ORAL_TABLET | ORAL | Status: DC | PRN
  Administered 2024-03-06 (×4): 650 mg via ORAL
  Filled 2024-03-06 (×5): qty 2

## 2024-03-06 MED ORDER — KETOROLAC TROMETHAMINE 30 MG/ML IJ SOLN
30.0000 mg | Freq: Once | INTRAMUSCULAR | Status: AC | PRN
  Administered 2024-03-06: 30 mg via INTRAVENOUS
  Filled 2024-03-06: qty 1

## 2024-03-06 MED ORDER — IBUPROFEN 800 MG PO TABS
800.0000 mg | ORAL_TABLET | Freq: Three times a day (TID) | ORAL | Status: AC
Start: 2024-03-06 — End: ?

## 2024-03-06 MED ORDER — WITCH HAZEL-GLYCERIN EX PADS: 1.0000 | MEDICATED_PAD | CUTANEOUS | Status: DC | PRN

## 2024-03-06 MED ORDER — SODIUM CHLORIDE 0.9% FLUSH: 3.0000 mL | INTRAVENOUS | Status: DC | PRN

## 2024-03-06 MED ORDER — SODIUM CHLORIDE 0.9% FLUSH
3.0000 mL | Freq: Two times a day (BID) | INTRAVENOUS | Status: AC
Start: 2024-03-06 — End: ?
  Administered 2024-03-06 (×2): 3 mL via INTRAVENOUS

## 2024-03-06 MED ORDER — BENZOCAINE-MENTHOL 20-0.5 % EX AERO
1.0000 | INHALATION_SPRAY | CUTANEOUS | Status: DC | PRN
  Administered 2024-03-08: 1 via TOPICAL
  Filled 2024-03-06: qty 56

## 2024-03-06 MED ORDER — SIMETHICONE 80 MG PO CHEW: 80.0000 mg | CHEWABLE_TABLET | ORAL | Status: DC | PRN

## 2024-03-06 MED ORDER — SODIUM CHLORIDE 0.9 % IV SOLN: 250.0000 mL | INTRAVENOUS | Status: DC | PRN

## 2024-03-06 MED ORDER — COCONUT OIL OIL: 1.0000 | TOPICAL_OIL | Status: DC | PRN

## 2024-03-06 NOTE — Anesthesia Postprocedure Evaluation (Signed)
 Anesthesia Post Note  Patient: [name]  Procedure(s) Performed: AN AD HOC LABOR EPIDURAL     Patient location during evaluation: Mother Baby Anesthesia Type: Epidural Level of consciousness: awake and alert Pain management: pain level controlled Vital Signs Assessment: post-procedure vital signs reviewed and stable Respiratory status: spontaneous breathing, nonlabored ventilation and respiratory function stable Cardiovascular status: stable Postop Assessment: no headache, no backache, epidural receding, no apparent nausea or vomiting, patient able to bend at knees, able to ambulate and adequate PO intake Anesthetic complications: no   No notable events documented.  Last Vitals:  Vitals:   03/06/24 1016 03/06/24 1254  BP: (!) 135/93 118/76  Pulse: 67 66  Resp:  18  Temp: 36.5 C 36.7 C  SpO2: 97% 98%    Last Pain:  Vitals:   03/06/24 1254  TempSrc: Oral  PainSc: 0-No pain   Pain Goal: Patients Stated Pain Goal: 0 (03/06/24 0947)                 Dellis Fermo

## 2024-03-06 NOTE — Progress Notes (Signed)
 Pt called out 0900 requesting to go the bathroom. Pt was transferred to the toilet using the [facility]. She reported that she felt like she really needed to void but that she was having difficulty. Encouraged pt to sit on the toilet for a few minutes. After sitting for a few minutes, pt reported that she had been able to void a few times, but not continuously. Pt assisted back to bed and provided with a k-pad to help with cramping. Pt called out again at 0945 reporting increasing abdominal cramping, rating pain 10/10. Upon entering the room, pt was very tearful and distressed. Administered ketorolac  30mg  IV for a 1x dose per Dr. Scherrie Curt. Encouraged pt to call out if pain was not improved within 20 minutes. Pt called out at 1016 reporting that her cramping was still 10/10. Vitals signs WDL except for BP of 135/93. Requested Marlys Singh CNM to come assess pt at bedside. Bladder scan revealed 616 mL. Payne CNM inserted foley catheter at 1030 and 1330 mL immediately drained. Pt reports relief and appears comfortable. Per Dr. Willey Harrier, leave catheter in place for now.

## 2024-03-06 NOTE — Progress Notes (Signed)
 POSTPARTUM PROGRESS NOTE  Subjective: Molly Mendez is a 23 y.o. G2P1011 s/p VAVD at [redacted]w[redacted]d. Foley placed at 1040 this morning due to retained urine and severe RLQ pain. Pain fully relieved once bladder emptied and she continues to report no pain at this time. Resting comfortably in bed.  Objective: Blood pressure 118/76, pulse 66, temperature 98.1 F (36.7 C), temperature source Oral, resp. rate 18, height 5' 3 (1.6 m), weight 99.8 kg, last menstrual period 06/09/2023, SpO2 98%, unknown if currently breastfeeding.  Assessment/Plan:  Urinary retention: Reviewed plan for indwelling foley catheter for next 7 days. Void trial needed in 7 days, explained we are coordinating now for outpatient appt All concerns addressed.   Cholestasis of pregnancy:  LFTs WNL today  Ida Mains, [facility] 03/06/24 1548

## 2024-03-06 NOTE — Lactation Note (Signed)
 This note was copied from a baby's chart. Lactation Consultation Note  Patient Name: Girl Yacine Droz [id number] Date: 03/06/2024 Age:24 hours Reason for consult: Initial assessment;Primapara;1st time breastfeeding;Early term 37-38.6wks;Maternal endocrine disorder  P1- MOB reports that infant has been formula fed only since birth because MOB has not been feeling well. MOB had been provided with a manual pump, so LC encouraged her to use the pump very 3 hrs if infant is not latching. LC reviewed the importance of breast stimulation in order to make milk. Infant had just drank a bottle of formula, and MOB was still not feeling well, so LC encouraged MOB to call for latching assistance once she is feeling better. LC reviewed the first 24 hr birthday nap, day 2 cluster feeding, feeding infant on cue 8-12x in 24 hrs, not allowing infant to go over 3 hrs without a feeding, CDC milk storage guidelines, LC services handout and engorgement/breast care. LC encouraged MOB to call for further assistance as needed.  Maternal Data Has patient been taught Hand Expression?: Yes Does the patient have breastfeeding experience prior to this delivery?: No  Feeding Mother's Current Feeding Choice: Breast Milk and Formula Nipple Type: Slow - flow  Lactation Tools Discussed/Used Pump Education: Milk Storage  Interventions Interventions: Breast feeding basics reviewed;Education;LC Services brochure  Discharge Discharge Education: Engorgement and breast care;Warning signs for feeding baby Pump: DEBP;Manual;Hands Free;Personal  Consult Status Consult Status: Follow-up Date: 03/07/24 Follow-up type: In-patient    [name] BS, IBCLC 03/06/2024, 2:29 PM

## 2024-03-06 NOTE — Lactation Note (Signed)
 This note was copied from a baby's chart. Lactation Consultation Note  Patient Name: Molly Mendez Today's Date: 03/06/2024 Age:24 hours   P1- MOB has a Do Not Disturb sign on her door at this time. LC will attempt to consult with her again at a later time today.   [name] BS, IBCLC 03/06/2024, 11:18 AM

## 2024-03-06 NOTE — Discharge Summary (Signed)
 Postpartum Discharge Summary  Date of Service updated     Patient Name: Molly Mendez DOB: 06/27/2001 MRN: [id number]  Date of admission: 03/04/2024 Delivery date:03/06/2024 Delivering provider: Melanie Spires Date of discharge: 03/08/2024  Admitting diagnosis: Encounter for induction of labor [Z34.90] Intrauterine pregnancy: [redacted]w[redacted]d     Secondary diagnosis:  Principal Problem:   Vacuum-assisted vaginal delivery Active Problems:   Obesity affecting pregnancy, antepartum   Encounter for induction of labor   Cholestasis of pregnancy  Additional problems: None    Discharge diagnosis: Term Pregnancy Delivered and cholestasis                                              Post partum procedures:None Augmentation: AROM, Pitocin , Cytotec , and IP Foley Complications: None  Hospital course: Induction of Labor With Vaginal Delivery   23 y.o. yo G2P1011 at [redacted]w[redacted]d was admitted to the hospital 03/04/2024 for induction of labor.  Indication for induction: Cholestasis of pregnancy.  Patient had an labor course complicated by prolonged fetal bradycardia, prompting vacuum assisted vaginal delivery. Membrane Rupture Time/Date: 5:21 PM,03/05/2024  Delivery Method:Vaginal, Vacuum (Extractor) Operative Delivery:Device used:Kiwi Indication: Fetal indications Episiotomy: None Lacerations:  1st degree;Perineal Details of delivery can be found in separate delivery note.  Patient had a postpartum course complicated by none. Patient is discharged home 03/08/24.  Newborn Data: Birth date:03/06/2024 Birth time:2:28 AM Gender:Female Living status:Living Apgars:4 ,8  Weight:2910 g  Magnesium Sulfate received: No BMZ received: No Rhophylac:N/A MMR:N/A T-DaP:Given prenatally Flu: Yes RSV Vaccine received: No Transfusion:No  Immunizations received: Immunization History  Administered Date(s) Administered   Influenza, Seasonal, Injecte, Preservative Fre 06/06/2023   Influenza,inj,Quad  PF,6+ Mos 07/10/2019, 10/26/2022   PFIZER(Purple Top)SARS-COV-2 Vaccination 01/29/2020, 02/19/2020   Tdap 02/28/2024    Physical exam  Vitals:   03/07/24 1226 03/07/24 1405 03/07/24 2029 03/08/24 0505  BP: 128/77 114/76 (!) 129/90 118/78  Pulse: 71 70 (!) 58 (!) 59  Resp:  16 18 16   Temp:  98 F (36.7 C) 98 F (36.7 C)   TempSrc:  Oral Oral   SpO2:  98%  99%  Weight:      Height:       General: alert, cooperative, and no distress Lochia: appropriate Uterine Fundus: firm Incision: N/A DVT Evaluation: No evidence of DVT seen on physical exam. Labs: Lab Results  Component Value Date   WBC 14.2 (H) 03/06/2024   HGB 12.5 03/06/2024   HCT 36.6 03/06/2024   MCV 83.2 03/06/2024   PLT 204 03/06/2024      Latest Ref Rng & Units 03/06/2024   11:03 AM  CMP  Glucose 70 - 99 mg/dL 80   BUN 6 - 20 mg/dL <5   Creatinine 4.09 - 1.00 mg/dL 8.11   Sodium 914 - 782 mmol/L 138   Potassium 3.5 - 5.1 mmol/L 4.0   Chloride 98 - 111 mmol/L 111   CO2 22 - 32 mmol/L 18   Calcium 8.9 - 10.3 mg/dL 8.8   Total Protein 6.5 - 8.1 g/dL 5.5   Total Bilirubin 0.0 - 1.2 mg/dL 0.8   Alkaline Phos 38 - 126 U/L 159   AST 15 - 41 U/L 25   ALT 0 - 44 U/L 15    Edinburgh Score:    03/06/2024   10:32 PM  Edinburgh Postnatal Depression Scale Screening Tool  I have been able to laugh and see the funny side of things. 0  I have looked forward with enjoyment to things. 0  I have blamed myself unnecessarily when things went wrong. 0  I have been anxious or worried for no good reason. 0  I have felt scared or panicky for no good reason. 0  Things have been getting on top of me. 0  I have been so unhappy that I have had difficulty sleeping. 0  I have felt sad or miserable. 0  I have been so unhappy that I have been crying. 0  The thought of harming myself has occurred to me. 0  Edinburgh Postnatal Depression Scale Total 0   No data recorded  After visit meds:  Allergies as of 03/08/2024        Reactions   Ibuprofen  Other (See Comments)   Burning feeling in stomach   Pork-derived Products         Medication List     STOP taking these medications    aspirin EC 81 MG tablet   hydrOXYzine  25 MG capsule Commonly known as: Vistaril    ursodiol  300 MG capsule Commonly known as: ACTIGALL        TAKE these medications    acetaminophen  500 MG tablet Commonly known as: TYLENOL  Take 2 tablets (1,000 mg total) by mouth every 8 (eight) hours as needed (for pain scale < 4).   ibuprofen  800 MG tablet Commonly known as: ADVIL  Take 1 tablet (800 mg total) by mouth every 8 (eight) hours as needed for mild pain (pain score 1-3) or moderate pain (pain score 4-6).   prenatal multivitamin Tabs tablet Take 1 tablet by mouth daily at 12 noon.   senna 8.6 MG Tabs tablet Commonly known as: SENOKOT Take 2 tablets (17.2 mg total) by mouth at bedtime as needed for mild constipation.         Discharge home in stable condition Infant Feeding: Breast Infant Disposition:home with mother Discharge instruction: per After Visit Summary and Postpartum booklet. Activity: Advance as tolerated. Pelvic rest for 6 weeks.  Diet: routine diet  Future Appointments:No future appointments.  Follow up Visit: Message sent to [facility] 6/12  Please schedule this patient for a In person postpartum visit in 6 weeks with the following provider: Any provider. Additional Postpartum F/U:Postpartum Depression checkup  High risk pregnancy complicated by: cholestasis of pregnancy Delivery mode:  Vaginal, Vacuum (Extractor) Anticipated Birth Control:  Condoms   03/08/2024 Maud Sorenson, MD

## 2024-03-06 NOTE — Progress Notes (Signed)
 CNM called to patient bedside as patient is patient is in 10/10 pain with sudden onset. Patient reports getting up to pee about 30 mins ago (only peeing a little) and worsening lower abdominal cramping. Patient received 30mg  of IV toradol  prior to CNM arrival.   BP (!) 135/93 (BP Location: Left Arm)   Pulse 67   Temp 97.7 F (36.5 C) (Oral)   Resp 18   Ht 5' 3 (1.6 m)   Wt 99.8 kg   LMP 06/09/2023   SpO2 97%   Breastfeeding Unknown   BMI 38.99 kg/m   Physical Exam Constitutional:      General: She is in acute distress.     Appearance: She is diaphoretic.  Pulmonary:     Effort: Pulmonary effort is normal.  Abdominal:     General: There is distension.     Tenderness: There is abdominal tenderness in the right lower quadrant. There is guarding. There is no rebound.     Comments: RLQ Protruding and diffusely tender to light palpation. Bowel sounds ABSENT in RLQ.   Genitourinary:    Urethra: Urethral pain and urethral swelling present.      Comments: Uterus Firm Midline and minimal bleeding noted.   Notable swelling just under urethra.   Skin:    General: Skin is warm.     Comments: Afebrile at 98.3 Axillary    Neurological:     Mental Status: She is alert.   - CBC, CMP and IM Morphine ordered.   -Bladder scan revealed 616. -Foley Cath placed by CNM and >1300 immediately drained into bag.  - Patient reports immediate relief.  - Continue with Foley Balloon at this time.  - Consult to Urology.  - Per Urology keep Foley in place for the next 7 days to account for bladder expansion. Then void trial and f/u with Urology or OB outpatient.   - Reviewed plan of care with Dr. Willey Harrier.   Roddrick Sharron Maurie Southern) Molly Singh, MSN, CNM  Center for [facility]  03/06/2024 12:38 PM

## 2024-03-07 NOTE — Progress Notes (Signed)
 Pt. Has voided 3 times. A Hat has been placed in pt toilet to measure

## 2024-03-07 NOTE — Progress Notes (Signed)
 In error     This encounter was created in error - please disregard.

## 2024-03-07 NOTE — Progress Notes (Addendum)
 POSTPARTUM PROGRESS NOTE  Post Partum Day 1  Subjective:  Akeiba Axelson is a 23 y.o. G2P1011 [redacted]w[redacted]d s/p vavd.  No acute events overnight.  Pt denies problems with ambulating, voiding or po intake.  She denies nausea or vomiting.  Pain is well controlled.  She has had flatus. She has not had bowel movement.  Lochia Moderate.   Objective: Blood pressure 117/80, pulse 70, temperature 97.9 F (36.6 C), temperature source Oral, resp. rate 16, height 5' 3 (1.6 m), weight 99.8 kg, last menstrual period 06/09/2023, SpO2 94%, unknown if currently breastfeeding.  Physical Exam:  General: alert, cooperative and no distress Lochia:normal flow Chest: CTAB Heart: RRR no m/r/g Abdomen: +BS, soft, nontender,  Uterine Fundus: firm,  DVT Evaluation: No calf swelling or tenderness Extremities: trace edema  Recent Labs    03/05/24 0027 03/06/24 1103  HGB 12.7 12.5  HCT 37.7 36.6    Assessment/Plan:  ASSESSMENT: Torin Whisner is a 23 y.o. G2P1011 [redacted]w[redacted]d s/p vavd, doing well. Has foley for urinary retention that failed I/o cath, urology curbsided and advised 1 week of foley and outpt tov, but patient has had foley now for 24 hours, will be here overnight, think it's reasonable to remove and TOV, can I/o cath as needed (doesn't appear she failed several I/o caths.  Condoms for contraception, plan for d/c tomorrow as baby needs to stay  Plan for discharge tomorrow   LOS: 3 days   Raymonde Calico 03/07/2024, 11:25 AM

## 2024-03-07 NOTE — Progress Notes (Signed)
 Pt has reported to this nurse that she has ate and is feeling much better since eating. Pt reports no lightheadedness or dizziness.

## 2024-03-07 NOTE — Lactation Note (Signed)
 This note was copied from a baby's chart. Lactation Consultation Note  Patient Name: Molly Mendez [id number] Date: 03/07/2024 Age:24 hours Reason for consult: Follow-up assessment;Primapara;1st time breastfeeding;Early term 37-38.6wks;Infant weight loss;Maternal endocrine disorder  P1- MOB reports that she has still been primarily bottle feeding infant with formula. MOB reports latching infant once last night, but stated that infant did not latch for long and it pinched. MOB was currently bottle feeding infant formula (over 30 mL), so LC encouraged her to call out for latching assistance with the next feeding. MOB reports that someone had set up the hospital DEBP for her today, but she hasn't used it. MOB requested to be flanged sized. LC reviewed how to use and clean the DEBP. LC sized her at an 18 mm flange and provided these to her. LC reviewed the importance of breast stimulation to ensure her milk comes in if that is what she is wanting to happen. LC also reviewed the outpatient LC services and strongly reccommended that MOB use them for support once discharged. MOB declined at this time. LC was going to try to pump with MOB, but the RN and tech came in to perform a bladder scan. LC encouraged MOB to call for further assistance as needed.  Maternal Data Has patient been taught Hand Expression?: No Does the patient have breastfeeding experience prior to this delivery?: No  Feeding Mother's Current Feeding Choice: Breast Milk and Formula Nipple Type: Slow - flow  LATCH Score       Type of Nipple: Flat            Lactation Tools Discussed/Used Tools: Pump;Flanges Flange Size: 18 Breast pump type: Double-Electric Breast Pump;Manual Pump Education: Setup, frequency, and cleaning;Milk Storage Reason for Pumping: MOB request Pumping frequency: 15-20 min every 3 hrs  Interventions Interventions: Breast feeding basics reviewed;Hand pump;DEBP;Education;LC Services  brochure  Discharge Discharge Education: Engorgement and breast care;Warning signs for feeding baby Pump: DEBP;Manual;Hands Free;Personal  Consult Status Consult Status: Follow-up Date: 03/08/24 Follow-up type: In-patient    [name] BS, IBCLC 03/07/2024, 4:41 PM

## 2024-03-07 NOTE — Progress Notes (Signed)
 MOB was referred for history of anxiety.  * Referral screened out by Clinical Social Worker because none of the following criteria appear to apply:  ~ History of anxiety during this pregnancy, or of post-partum depression following prior delivery.  ~ Diagnosis of a anxiety within last 3 years  Per OB notes, MOB did not indicate any signs/symptoms during her pregnancy. Anxiety 2020  OR  * MOB's symptoms currently being treated with medication and/or therapy.  Please contact the Clinical Social Worker if needs arise, by [facility] request, or if MOB scores greater than 9/yes to question 10 on Edinburgh Postpartum Depression Screen.  Jenney Modest, Milinda Allen Clinical Social Worker [phone]

## 2024-03-07 NOTE — Progress Notes (Signed)
 Pt stated she has not ate yet, but got up to go to bathroom and was light headed/dizzy. After sitting down pt stated she got up too fast. This nurse educated pt to please eat something and we will try ambulating again after eating to see if pt Is still light headed or dizzy. Pt verbalized understanding.

## 2024-03-08 MED ORDER — IBUPROFEN 800 MG PO TABS: 800.0000 mg | ORAL_TABLET | Freq: Three times a day (TID) | ORAL | 0 refills | Status: DC | PRN

## 2024-03-08 MED ORDER — SENNA 8.6 MG PO TABS: 2.0000 | ORAL_TABLET | Freq: Every evening | ORAL | 0 refills | Status: DC | PRN

## 2024-03-08 MED ORDER — ACETAMINOPHEN 500 MG PO TABS: 1000.0000 mg | ORAL_TABLET | Freq: Three times a day (TID) | ORAL | 0 refills | Status: DC | PRN

## 2024-03-08 NOTE — Lactation Note (Signed)
 This note was copied from a baby's chart. Lactation Consultation Note  Patient Name: Molly Mendez [id number] Date: 03/08/2024 Age:24 hours Reason for consult: Follow-up assessment;Infant weight loss;Primapara;1st time breastfeeding;Difficult latch (8 % weight loss) LC reviewed supply and demand, importance of being consistent with pumping to establish, protect the milk supply, and to exercise the the nipple / areola complex for a latch. lung cancer provided shells while awake between feedings.  LC recommended when she goes home to work on the pumping to bring the milk in and when she notices the nipple is more erect and the areola more compressible, attempt to latch.  Since the baby is less than 6 pounds now , 8 % weight loss if the baby latches feed 15 -20 mins , supplement at least 30 ml and then post  pump. Next feeding switch to the other breast and do the same.  See below for [facility] O/P details.   Maternal Data Has patient been taught Hand Expression?: Yes Does the patient have breastfeeding experience prior to this delivery?: No  Feeding Mother's Current Feeding Choice: Breast Milk and Formula Nipple Type: Slow - flow  LATCH Score - LC unable to assess latch baby just fed     Lactation Tools Discussed/Used Tools: Pump;Flanges Flange Size: 18;21 Breast pump type: Manual;Double-Electric Breast Pump Pump Education: Setup, frequency, and cleaning;Milk Storage Pumped volume:  (per mom drops)  Interventions Interventions: Breast feeding basics reviewed;Hand pump;DEBP;Shells;LC Services brochure;CDC milk storage guidelines;CDC Guidelines for Breast Pump Cleaning  Discharge Discharge Education: Engorgement and breast care;Warning signs for feeding baby;Outpatient Epic message sent;Other (comment) (mom receptive and aware she will receive a phone call) Pump: DEBP;Manual;Hands Free;Personal  Consult Status Consult Status: Complete Date: 03/08/24    Richarda Chance 03/08/2024, 9:52 AM

## 2024-03-10 ENCOUNTER — Other Ambulatory Visit: Payer: Self-pay

## 2024-03-10 ENCOUNTER — Encounter (HOSPITAL_COMMUNITY): Payer: Self-pay | Admitting: Obstetrics and Gynecology

## 2024-03-10 ENCOUNTER — Inpatient Hospital Stay (HOSPITAL_COMMUNITY)
Admission: AD | Admit: 2024-03-10 | Discharge: 2024-03-10 | Disposition: A | Discharge status: Home / Self Care | Source: Ambulatory Visit | Attending: Obstetrics and Gynecology | Admitting: Obstetrics and Gynecology

## 2024-03-10 ENCOUNTER — Ambulatory Visit (INDEPENDENT_AMBULATORY_CARE_PROVIDER_SITE_OTHER): Admitting: Student

## 2024-03-10 ENCOUNTER — Encounter: Payer: Self-pay | Admitting: Student

## 2024-03-10 VITALS — BP 139/86 | HR 80 | Ht 62.0 in | Wt 210.0 lb

## 2024-03-10 DIAGNOSIS — R519 Headache, unspecified: Secondary | ICD-10-CM | POA: Diagnosis not present

## 2024-03-10 DIAGNOSIS — I159 Secondary hypertension, unspecified: Secondary | ICD-10-CM | POA: Insufficient documentation

## 2024-03-10 DIAGNOSIS — M7989 Other specified soft tissue disorders: Secondary | ICD-10-CM | POA: Diagnosis not present

## 2024-03-10 DIAGNOSIS — O9089 Other complications of the puerperium, not elsewhere classified: Secondary | ICD-10-CM | POA: Diagnosis present

## 2024-03-10 LAB — CBC WITH DIFFERENTIAL/PLATELET
Abs Immature Granulocytes: 0.06 10*3/uL (ref 0.00–0.07)
Basophils Absolute: 0 10*3/uL (ref 0.0–0.1)
Basophils Relative: 0 %
Eosinophils Absolute: 0.2 10*3/uL (ref 0.0–0.5)
Eosinophils Relative: 2 %
HCT: 40.1 % (ref 36.0–46.0)
Hemoglobin: 13.4 g/dL (ref 12.0–15.0)
Immature Granulocytes: 1 %
Lymphocytes Relative: 18 %
Lymphs Abs: 1.7 10*3/uL (ref 0.7–4.0)
MCH: 27.9 pg (ref 26.0–34.0)
MCHC: 33.4 g/dL (ref 30.0–36.0)
MCV: 83.5 fL (ref 80.0–100.0)
Monocytes Absolute: 0.7 10*3/uL (ref 0.1–1.0)
Monocytes Relative: 8 %
Neutro Abs: 6.7 10*3/uL (ref 1.7–7.7)
Neutrophils Relative %: 71 %
Platelets: 303 10*3/uL (ref 150–400)
RBC: 4.8 MIL/uL (ref 3.87–5.11)
RDW: 12.8 % (ref 11.5–15.5)
WBC: 9.4 10*3/uL (ref 4.0–10.5)
nRBC: 0 % (ref 0.0–0.2)

## 2024-03-10 LAB — COMPREHENSIVE METABOLIC PANEL WITH GFR
ALT: 22 U/L (ref 0–44)
AST: 26 U/L (ref 15–41)
Albumin: 2.8 g/dL — ABNORMAL LOW (ref 3.5–5.0)
Alkaline Phosphatase: 131 U/L — ABNORMAL HIGH (ref 38–126)
Anion gap: 9 (ref 5–15)
BUN: 8 mg/dL (ref 6–20)
CO2: 20 mmol/L — ABNORMAL LOW (ref 22–32)
Calcium: 8.6 mg/dL — ABNORMAL LOW (ref 8.9–10.3)
Chloride: 109 mmol/L (ref 98–111)
Creatinine, Ser: 0.75 mg/dL (ref 0.44–1.00)
GFR, Estimated: >60 mL/min (ref >60)
Glucose, Bld: 78 mg/dL (ref 70–99)
Potassium: 4.1 mmol/L (ref 3.5–5.1)
Sodium: 138 mmol/L (ref 135–145)
Total Bilirubin: 0.4 mg/dL (ref 0.0–1.2)
Total Protein: 6.5 g/dL (ref 6.5–8.1)

## 2024-03-10 MED ORDER — TRAMADOL HCL 50 MG PO TABS: 50.0000 mg | ORAL_TABLET | Freq: Once | ORAL | Status: DC

## 2024-03-10 MED ORDER — ACETAMINOPHEN-CAFFEINE 500-65 MG PO TABS
2.0000 | ORAL_TABLET | Freq: Once | ORAL | Status: AC
  Administered 2024-03-10: 2 via ORAL
  Filled 2024-03-10: qty 2

## 2024-03-10 MED ORDER — ACETAMINOPHEN 500 MG PO TABS: 1000.0000 mg | ORAL_TABLET | Freq: Once | ORAL | Status: DC

## 2024-03-10 MED ORDER — OXYCODONE HCL 5 MG PO TABS
5.0000 mg | ORAL_TABLET | Freq: Once | ORAL | Status: AC
  Administered 2024-03-10: 5 mg via ORAL
  Filled 2024-03-10: qty 1

## 2024-03-10 MED ORDER — FUROSEMIDE 20 MG PO TABS: 20.0000 mg | ORAL_TABLET | Freq: Every day | ORAL | 0 refills | Status: DC

## 2024-03-10 NOTE — Progress Notes (Signed)
    SUBJECTIVE:   CHIEF COMPLAINT / HPI:   Postpartum headache, vision changes -Patient is 4 days postpartum following a vacuum assisted vaginal delivery on 03/06/2024 using Kiwi device for fetal indications, had a first-degree perineal laceration. -She was having urinary retention, requiring Foley placement given family I's/O cath with urology curb sided.  Foley was removed before discharge on 03/08/2024.  -Headache started a day or so after discharge, severe and frontal in nature that has minimal to no resolution with tylenol  - Also has flashing spots of light/color in her vision - Swelling in legs, hands but not face -BP today 139/86  PERTINENT  PMH / PSH: Reviewed.  OBJECTIVE:   BP 139/86   Pulse 80   Ht 5' 2 (1.575 m)   Wt 210 lb (95.3 kg)   LMP 06/09/2023   SpO2 100%   Breastfeeding Yes   BMI 38.41 kg/m    General: Well-appearing, no acute distress. Cardio: Regular rate, regular rhythm, no murmurs on exam. Pulm: Clear, no wheezing, no crackles. No increased work of breathing. Extremities: 2+ peripheral edema. Moves all extremities equally. Neuro: Alert and oriented x3, speech normal in content, no facial asymmetry, strength intact and equal bilaterally in UE and LE, pupils equal and reactive to light.  Psych:  Cognition and judgment appear intact. Alert, communicative, and cooperative with normal attention span and concentration.  ASSESSMENT/PLAN:   Assessment & Plan Intractable headache, unspecified chronicity pattern, unspecified headache type Headache not improved with OTC analgesics, vision changes and elevated blood pressure (139/86) concerning for postaprtum pre-eclampsia - Most appropriate to go to [facility] for STAT labs and evaluation of pre-E - Patient and husband in agreement, will self-transport    Vallorie Gayer, DO [facility]

## 2024-03-10 NOTE — Lactation Note (Addendum)
 Lactation Consultation Note  Patient Name: Molly Mendez Today's Date: 03/10/2024 Age:23 y.o. Reason for consult: RN request;Mother's request;Difficult latch;Engorgement;1st time breastfeeding.  CO: MOB has engorgement, flat nipples, infant one week old only latch for 1-2 minutes max at the breast, MOB when pumping is having pain and pinching.   LC requested in [facility] to assist with latching infant at the breast. MOB is currently engorged, nipples are flat. LC assisted with pumping and re-fitted MOB with size 18 mm breast flange and ice applied, MOB expressed 4 ounces (120 ml) of EBM. MOB soften breast first attempted latch infant without NS, infant would not latch, MOB applied 20 mm NS was pre-filled with 0.5 mls of EBM, infant latched and breastfeed for 10 minutes. Per MOB, this is first time infant has latched or sustained a latch at the breast. After infant latched at the breast FOB supplemented infant with 30 mls of EBM. LC observed infant appeared jaundice and informed RN. Infant had two yellow seedy stools while LC was in the room. MOB will change her flange size at home to 18 or 19 with her DEBP will fit for comfort MOB knows that she should not feel pain or pinching when pumping.   Current feeding plan: 1- MOB will apply 20 mm NS prior to latch and breastfeeding infant by cues, on demand, 8+ times within 24 hours.  2- After latching infant at the breast, MOB will continue to supplement infant with her own EBM until infant is latching well and without NS. MOB has handout Supplementing with Breastfeeding. 3- MOB will continue to use the DEBP due to using NS, MOB will pump every 3 hours or sooner if breast are feeling full but not time for infant feeding she will pump to soften breast but not empty breast. 4- LC discussed engorgement treatment and prevention. 5- [facility]  for breastfeeding follow and support due to using a NS and having latch  difficulties, RN sent referral tonight. MOB is aware of [place] BF support  group and [facility] outpatient clinic hand out given with breastfeeding community resources.  Maternal Data    Feeding Mother's Current Feeding Choice: Breast Milk and Formula  LATCH Score Latch: Repeated attempts needed to sustain latch, nipple held in mouth throughout feeding, stimulation needed to elicit sucking reflex.  Audible Swallowing: Spontaneous and intermittent  Type of Nipple: Flat  Comfort (Breast/Nipple): Soft / non-tender  Hold (Positioning): Assistance needed to correctly position infant at breast and maintain latch.  LATCH Score: 7   Lactation Tools Discussed/Used Tools: Flanges Flange Size: 18 (LC re-fitted MOB with 18 mm breast flange was having pinching and pain when using 24 mm breast flange.)  Interventions Interventions: Assisted with latch;Skin to skin;Adjust position;Support pillows;Position options;Expressed milk;DEBP;Pace feeding;Education;Guidelines for Milk Supply and Pumping Schedule Handout;LC Services brochure;CDC milk storage guidelines;CDC Guidelines for Breast Pump Cleaning  Discharge Pump: DEBP;Personal  Consult Status Consult Status: Complete (mother declined follow up) [facility] sent referral for Outpatient LC)    Molly Mendez 03/10/2024, 9:43 PM

## 2024-03-10 NOTE — Discharge Instructions (Signed)
[facility]  Tuesdays & Thursdays 9:30am-3:30pm [address], [place], [place] Located in the [employer] [facility] ([facility] Room [address]A  For more information: Call: [phone] or Email: [email or web address] To book a FREE appt: [email or web address]  Offering prenatal consults,  postpartum outpatient lactation consults,  pumping consults and  "Each One, Teach One" informal lactation education for support persons.

## 2024-03-10 NOTE — MAU Provider Note (Addendum)
 History     CSN: [id number]  Arrival date and time: 03/10/24 1646   Event Date/Time   First Provider Initiated Contact with Patient 03/10/24 1848      Chief Complaint  Patient presents with   BP Evaluation   Molly Mendez , a  23 y.o. G2P1011 at 1 week PP presents to MAU for complaints of headache and elevated BPs in the office today for her PP visit. She states that she has had an on-going headache for the majority of the day. Rating as a 8/10 and notes increased pressure behind her eyes. She states that she has not been resting well, but also denies drinking fluids and eating since this morning. Denies attempting to relieve symptoms. She reports since yesterday seeing floaters and stars In her vision. She also endorses increased swelling since delivery especially in her hands and feet.          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   PCOS (polycystic ovarian syndrome)    UTI (urinary tract infection) 02/25/2020    Past Surgical History:  Procedure Laterality Date   EYE MUSCLE SURGERY Right x2   as a child   MEDIAN RECTUS REPAIR Right 05/06/2014   Procedure: MEDIAN RECTUS RESECTION;  Surgeon: Ozell DELENA Mirza, MD;  Location: [facility];  Service: Ophthalmology;  Laterality: Right;    Family History  Problem Relation Age of Onset   Anxiety disorder Mother    High Cholesterol Mother    High blood pressure Mother    Heart Problems Mother    Anxiety disorder Sister    Depression Sister    Anxiety disorder Brother    Depression Brother    High blood pressure Maternal Grandmother    High blood pressure Maternal Grandfather    High blood  pressure Paternal Grandmother    High blood pressure Paternal Grandfather     Social History   Tobacco Use   Smoking status: Former    Types: E-cigarettes    Passive exposure: Never   Smokeless tobacco: Never  Vaping Use   Vaping status: Former   Substances: Flavoring  Substance Use Topics   Alcohol use: No   Drug use: Never    Allergies:  Allergies  Allergen Reactions   Ibuprofen  Other (See Comments)    Burning feeling in stomach   Pork-Derived Products     Medications Prior to Admission  Medication Sig Dispense Refill Last Dose/Taking   acetaminophen  (TYLENOL ) 500 MG tablet Take 2 tablets (1,000 mg total) by mouth every 8 (eight) hours as needed (for pain scale < 4). 60 tablet 0 03/09/2024   Prenatal Vit-Fe Fumarate-FA (PRENATAL MULTIVITAMIN) TABS tablet Take 1 tablet by mouth daily at 12 noon.   03/09/2024   ibuprofen  (ADVIL ) 800 MG tablet Take 1 tablet (800 mg total) by mouth every 8 (eight) hours as needed for mild pain (pain score 1-3) or moderate pain (  pain score 4-6). 60 tablet 0    senna (SENOKOT) 8.6 MG TABS tablet Take 2 tablets (17.2 mg total) by mouth at bedtime as needed for mild constipation. 60 tablet 0     Review of Systems  Constitutional:  Negative for chills, fatigue and fever.  Eyes:  Positive for pain. Negative for visual disturbance.  Respiratory:  Negative for apnea, shortness of breath and wheezing.   Cardiovascular:  Negative for chest pain and palpitations.  Gastrointestinal:  Negative for abdominal pain, constipation, diarrhea, nausea and vomiting.  Genitourinary:  Negative for difficulty urinating, dysuria, pelvic pain, vaginal bleeding, vaginal discharge and vaginal pain.  Musculoskeletal:  Negative for back pain.  Neurological:  Positive for dizziness and headaches. Negative for seizures and weakness.  Psychiatric/Behavioral:  Negative for suicidal ideas.    Physical Exam   Blood pressure 129/80, pulse 73, temperature 98.8 F (37.1 C),  temperature source Oral, resp. rate 18, SpO2 99%, currently breastfeeding.  Physical Exam Vitals and nursing note reviewed.  Constitutional:      General: She is not in acute distress.    Appearance: Normal appearance.  HENT:     Head: Normocephalic.   Cardiovascular:     Rate and Rhythm: Normal rate and regular rhythm.  Pulmonary:     Effort: Pulmonary effort is normal.  Abdominal:     Palpations: Abdomen is soft.     Tenderness: There is no abdominal tenderness.   Musculoskeletal:        General: Swelling present.     Cervical back: Normal range of motion.     Right lower leg: Edema present.     Left lower leg: Edema present.     Comments: Mild non-pitting bilateral pedal edema    Skin:    General: Skin is warm and dry.   Neurological:     Mental Status: She is alert and oriented to person, place, and time.   Psychiatric:        Mood and Affect: Mood normal.     MAU Course  Procedures Patient Vitals for the past 24 hrs:  BP Temp Temp src Pulse Resp SpO2  03/10/24 1931 122/80 -- -- 74 -- --  03/10/24 1915 130/89 -- -- 71 -- 100 %  03/10/24 1908 131/79 -- -- 68 -- --  03/10/24 1848 139/89 -- -- 72 -- --  03/10/24 1830 129/80 -- -- 73 -- 99 %  03/10/24 1815 127/84 -- -- 70 -- 99 %  03/10/24 1800 136/86 -- -- 78 -- 99 %  03/10/24 1745 126/83 -- -- 73 -- 99 %  03/10/24 1735 133/88 98.8 F (37.1 C) Oral 83 18 97 %    Orders Placed This Encounter  Procedures   CBC with Differential/Platelet   Comprehensive metabolic panel   Results for orders placed or performed during the hospital encounter of 03/10/24 (from the past 24 hours)  CBC with Differential/Platelet     Status: None   Collection Time: 03/10/24  5:34 PM  Result Value Ref Range   WBC 9.4 4.0 - 10.5 K/uL   RBC 4.80 3.87 - 5.11 MIL/uL   Hemoglobin 13.4 12.0 - 15.0 g/dL   HCT 59.8 63.9 - 53.9 %   MCV 83.5 80.0 - 100.0 fL   MCH 27.9 26.0 - 34.0 pg   MCHC 33.4 30.0 - 36.0 g/dL   RDW 87.1 88.4 - 84.4 %    Platelets 303 150 - 400 K/uL   nRBC 0.0 0.0 - 0.2 %  Neutrophils Relative % 71 %   Neutro Abs 6.7 1.7 - 7.7 K/uL   Lymphocytes Relative 18 %   Lymphs Abs 1.7 0.7 - 4.0 K/uL   Monocytes Relative 8 %   Monocytes Absolute 0.7 0.1 - 1.0 K/uL   Eosinophils Relative 2 %   Eosinophils Absolute 0.2 0.0 - 0.5 K/uL   Basophils Relative 0 %   Basophils Absolute 0.0 0.0 - 0.1 K/uL   Immature Granulocytes 1 %   Abs Immature Granulocytes 0.06 0.00 - 0.07 K/uL  Comprehensive metabolic panel     Status: Abnormal   Collection Time: 03/10/24  5:34 PM  Result Value Ref Range   Sodium 138 135 - 145 mmol/L   Potassium 4.1 3.5 - 5.1 mmol/L   Chloride 109 98 - 111 mmol/L   CO2 20 (L) 22 - 32 mmol/L   Glucose, Bld 78 70 - 99 mg/dL   BUN 8 6 - 20 mg/dL   Creatinine, Ser 9.24 0.44 - 1.00 mg/dL   Calcium 8.6 (L) 8.9 - 10.3 mg/dL   Total Protein 6.5 6.5 - 8.1 g/dL   Albumin 2.8 (L) 3.5 - 5.0 g/dL   AST 26 15 - 41 U/L   ALT 22 0 - 44 U/L   Alkaline Phosphatase 131 (H) 38 - 126 U/L   Total Bilirubin 0.4 0.0 - 1.2 mg/dL   GFR, Estimated >39 >39 mL/min   Anion gap 9 5 - 15    MDM - PO Excedrin ordered - BPs upper limits of normal while in MAU  - PreE labs normal  - Plan to send home on PO lasix  - Lactation at bedside to assess infant latch. Of note Infant looking yellow in MAU. Dr. Lola to bedside to evaluate. Per MD continue to put baby to breast and have Peds reevaluate in the morning.   - plan for discharge   Assessment and Plan   1. Intractable headache, unspecified chronicity pattern, unspecified headache type   2. Secondary hypertension   3. Postpartum care and examination    - Reviewed worsening signs and return precautions.  - Amb Ref to [employer] lactation for patient help.  - Baby has a Peds appt in the AM. Recommended to continue to monitor color. Patient verbalized understanding.  - Rx for 5 day course of lasix sent to outpatient pharmacy.  - Patient discharged home in stable  condition and may return to MAU as needed.    Due to language barrier, an interpreter was present during the history-taking and subsequent discussion (and for part of the physical exam) with this patient. - In person spanish interpreter used for the duration of this visit.   Claris CHRISTELLA Cedar, MSN CNM  03/10/2024, 6:49 PM

## 2024-03-10 NOTE — MAU Note (Signed)
 Molly Mendez is a 23 y.o. at Unknown here in MAU reporting: she was sent from [facility] office for BP evaluation.  Reports current HA and visual disturbances.  Denies current epigastric pain, but had last night.  Reports last took Tylenol  for HA was last night, relief noted.  S/P NVD 03/06/2024  LMP: NA Onset of complaint: today Pain score: 8 Vitals:   03/10/24 1735  BP: 133/88  Pulse: 83  Resp: 18  Temp: 98.8 F (37.1 C)  SpO2: 97%     FHT: NA  Lab orders placed from triage: None

## 2024-03-10 NOTE — MAU Provider Note (Incomplete Revision)
 History     CSN: [id number]  Arrival date and time: 03/10/24 1646   Event Date/Time   First Provider Initiated Contact with Patient 03/10/24 1848      Chief Complaint  Patient presents with   BP Evaluation   Molly Mendez , a  23 y.o. G2P1011 at 1 week PP presents to MAU for complaints of headache and elevated BPs in the office today for her PP visit. She states that she has had an on-going headache for the majority of the day. Rating as a 8/10 and notes increased pressure behind her eyes. She states that she has not been resting well, but also denies drinking fluids and eating since this morning. Denies attempting to relieve symptoms. She reports since yesterday seeing floaters and stars In her vision. She also endorses increased swelling since delivery especially in her hands and feet.          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   PCOS (polycystic ovarian syndrome)    UTI (urinary tract infection) 02/25/2020    Past Surgical History:  Procedure Laterality Date   EYE MUSCLE SURGERY Right x2   as a child   MEDIAN RECTUS REPAIR Right 05/06/2014   Procedure: MEDIAN RECTUS RESECTION;  Surgeon: Hoyle Maclachlan, MD;  Location: [facility];  Service: Ophthalmology;  Laterality: Right;    Family History  Problem Relation Age of Onset   Anxiety disorder Mother    High Cholesterol Mother    High blood pressure Mother    Heart Problems Mother    Anxiety disorder Sister    Depression Sister    Anxiety disorder Brother    Depression Brother    High blood pressure Maternal Grandmother    High blood pressure Maternal Grandfather    High blood  pressure Paternal Grandmother    High blood pressure Paternal Grandfather     Social History   Tobacco Use   Smoking status: Former    Types: E-cigarettes    Passive exposure: Never   Smokeless tobacco: Never  Vaping Use   Vaping status: Former   Substances: Flavoring  Substance Use Topics   Alcohol use: No   Drug use: Never    Allergies:  Allergies  Allergen Reactions   Ibuprofen  Other (See Comments)    Burning feeling in stomach   Pork-Derived Products     Medications Prior to Admission  Medication Sig Dispense Refill Last Dose/Taking   acetaminophen  (TYLENOL ) 500 MG tablet Take 2 tablets (1,000 mg total) by mouth every 8 (eight) hours as needed (for pain scale < 4). 60 tablet 0 03/09/2024   Prenatal Vit-Fe Fumarate-FA (PRENATAL MULTIVITAMIN) TABS tablet Take 1 tablet by mouth daily at 12 noon.   03/09/2024   ibuprofen  (ADVIL ) 800 MG tablet Take 1 tablet (800 mg total) by mouth every 8 (eight) hours as needed for mild pain (pain score 1-3) or moderate pain (  pain score 4-6). 60 tablet 0    senna (SENOKOT) 8.6 MG TABS tablet Take 2 tablets (17.2 mg total) by mouth at bedtime as needed for mild constipation. 60 tablet 0     Review of Systems  Constitutional:  Negative for chills, fatigue and fever.  Eyes:  Positive for pain. Negative for visual disturbance.  Respiratory:  Negative for apnea, shortness of breath and wheezing.   Cardiovascular:  Negative for chest pain and palpitations.  Gastrointestinal:  Negative for abdominal pain, constipation, diarrhea, nausea and vomiting.  Genitourinary:  Negative for difficulty urinating, dysuria, pelvic pain, vaginal bleeding, vaginal discharge and vaginal pain.  Musculoskeletal:  Negative for back pain.  Neurological:  Positive for dizziness and headaches. Negative for seizures and weakness.  Psychiatric/Behavioral:  Negative for suicidal ideas.    Physical Exam   Blood pressure 129/80, pulse 73, temperature 98.8 F (37.1 C),  temperature source Oral, resp. rate 18, SpO2 99%, currently breastfeeding.  Physical Exam Vitals and nursing note reviewed.  Constitutional:      General: She is not in acute distress.    Appearance: Normal appearance.  HENT:     Head: Normocephalic.   Cardiovascular:     Rate and Rhythm: Normal rate and regular rhythm.  Pulmonary:     Effort: Pulmonary effort is normal.  Abdominal:     Palpations: Abdomen is soft.     Tenderness: There is no abdominal tenderness.   Musculoskeletal:        General: Swelling present.     Cervical back: Normal range of motion.     Right lower leg: Edema present.     Left lower leg: Edema present.     Comments: Mild non-pitting bilateral pedal edema    Skin:    General: Skin is warm and dry.   Neurological:     Mental Status: She is alert and oriented to person, place, and time.   Psychiatric:        Mood and Affect: Mood normal.     MAU Course  Procedures Patient Vitals for the past 24 hrs:  BP Temp Temp src Pulse Resp SpO2  03/10/24 1931 122/80 -- -- 74 -- --  03/10/24 1915 130/89 -- -- 71 -- 100 %  03/10/24 1908 131/79 -- -- 68 -- --  03/10/24 1848 139/89 -- -- 72 -- --  03/10/24 1830 129/80 -- -- 73 -- 99 %  03/10/24 1815 127/84 -- -- 70 -- 99 %  03/10/24 1800 136/86 -- -- 78 -- 99 %  03/10/24 1745 126/83 -- -- 73 -- 99 %  03/10/24 1735 133/88 98.8 F (37.1 C) Oral 83 18 97 %    Orders Placed This Encounter  Procedures   CBC with Differential/Platelet   Comprehensive metabolic panel   Results for orders placed or performed during the hospital encounter of 03/10/24 (from the past 24 hours)  CBC with Differential/Platelet     Status: None   Collection Time: 03/10/24  5:34 PM  Result Value Ref Range   WBC 9.4 4.0 - 10.5 K/uL   RBC 4.80 3.87 - 5.11 MIL/uL   Hemoglobin 13.4 12.0 - 15.0 g/dL   HCT 81.1 91.4 - 78.2 %   MCV 83.5 80.0 - 100.0 fL   MCH 27.9 26.0 - 34.0 pg   MCHC 33.4 30.0 - 36.0 g/dL   RDW 95.6 21.3 - 08.6 %    Platelets 303 150 - 400 K/uL   nRBC 0.0 0.0 - 0.2 %  Neutrophils Relative % 71 %   Neutro Abs 6.7 1.7 - 7.7 K/uL   Lymphocytes Relative 18 %   Lymphs Abs 1.7 0.7 - 4.0 K/uL   Monocytes Relative 8 %   Monocytes Absolute 0.7 0.1 - 1.0 K/uL   Eosinophils Relative 2 %   Eosinophils Absolute 0.2 0.0 - 0.5 K/uL   Basophils Relative 0 %   Basophils Absolute 0.0 0.0 - 0.1 K/uL   Immature Granulocytes 1 %   Abs Immature Granulocytes 0.06 0.00 - 0.07 K/uL  Comprehensive metabolic panel     Status: Abnormal   Collection Time: 03/10/24  5:34 PM  Result Value Ref Range   Sodium 138 135 - 145 mmol/L   Potassium 4.1 3.5 - 5.1 mmol/L   Chloride 109 98 - 111 mmol/L   CO2 20 (L) 22 - 32 mmol/L   Glucose, Bld 78 70 - 99 mg/dL   BUN 8 6 - 20 mg/dL   Creatinine, Ser 1.61 0.44 - 1.00 mg/dL   Calcium 8.6 (L) 8.9 - 10.3 mg/dL   Total Protein 6.5 6.5 - 8.1 g/dL   Albumin 2.8 (L) 3.5 - 5.0 g/dL   AST 26 15 - 41 U/L   ALT 22 0 - 44 U/L   Alkaline Phosphatase 131 (H) 38 - 126 U/L   Total Bilirubin 0.4 0.0 - 1.2 mg/dL   GFR, Estimated >09 >60 mL/min   Anion gap 9 5 - 15    MDM - PO Excedrin ordered - BPs upper limits of normal while in MAU  - PreE labs normal  - Plan to send home on PO lasix  - Lactation at bedside to assess infant latch. Of note Infant looking yellow in MAU. Dr. Ilona Malta to bedside to evaluate. Per MD continue to put baby to breast and have Peds reevaluate in the morning.   - plan for discharge   Assessment and Plan   1. Intractable headache, unspecified chronicity pattern, unspecified headache type   2. Secondary hypertension   3. Postpartum care and examination    - Reviewed worsening signs and return precautions.  - Rx for 5 day course of lasix sent to outpatient pharmacy.  - Patient discharged home in stable condition and may return to MAU as needed.    Corie Diamond, MSN CNM  03/10/2024, 6:49 PM

## 2024-03-10 NOTE — Patient Instructions (Signed)
 It was great seeing you today. Congratulations on your baby!  - Please go to the Maternity Assessment Unit at [facility]. With your headache, blood pressure elevation it is important to get evaluated today and have bloodwork drawn   If you have any questions or concerns, please feel free to call the clinic.   Have a wonderful day,  Dr. Vallorie Gayer [facility] [phone]

## 2024-04-24 ENCOUNTER — Ambulatory Visit: Admitting: Family Medicine

## 2024-04-24 ENCOUNTER — Encounter: Payer: Self-pay | Admitting: Family Medicine

## 2024-04-24 NOTE — Progress Notes (Signed)
  [facility] Postpartum Visit   Molly Mendez Alveria is a 23 y.o. [phone] presenting for a postpartum visit.  She has the following concerns today: post epidural back pain  She delivered via vacuum assisted vaginal delivery at [redacted]w[redacted]d.  She reports her vaginal bleeding has stopped after 3rd or 4th week. She is formula feeding her infant. She feels she is bonding well. She is considering condoms for contraception.   Edinburgh Postnatal Depression Scale: 0 (10 or higher is positive)  Reviewed pregnancy and delivery course.    Vitals:   04/24/24 0955 04/24/24 1024  BP: 135/86 114/78  Pulse: 99   SpO2: 100%    Exam: General: A&O, NAD HEENT: No sign of trauma, EOM grossly intact Cardiac: RRR, no m/r/g Respiratory: CTAB, normal WOB, no w/c/r GI: Soft, NTTP, non-distended  Extremities: NTTP, no peripheral edema. Neuro: Normal gait, moves all four extremities appropriately. Psych: Appropriate mood and affect   A/P:  Postpartum visit: patient is 7 weeks postpartum following a vacuum assisted vaginal delivery. -Discussed patients delivery and complications -Patient had a 1st degree perineal laceration, perineal healing reviewed. Patient expressed understanding -Patient has urinary incontinence? No  -Patient is safe to resume physical and sexual activity -Patient does not want a pregnancy in the next year.  Desired family size is unknown children.  -Reviewed forms of contraception in tiered fashion. Patient desired condoms today.   -Return to sexual activity and contraception discussed as above.  -Discussed birth spacing of 18 months -Breastfeeding: Yes, provided support of decision and resources as indicated  -Mood: no issues with mood   -Discussed sleep and fatigue management and encouraged family/community support.  -Postpartum vaccines: none needed -Need for postpartum diabetes screening:  No  -Reviewed prior Pap and she is next due for Pap 03/2025.  -Return in  Return if symptoms worsen or fail to improve.

## 2024-04-24 NOTE — Patient Instructions (Addendum)
 It was wonderful to see you today. I'm glad you and baby are doing well! I have included some information below about your back pain:   Treating Back Pain at Home After an Epidural Since it can be difficult to determine what back pain is related to an epidural as opposed to birth, they are often treated the same way. Many at-home remedies help ease this back pain, in addition to treatment by back pain specialists like chiropractors.  Massages Even a simple massage from a partner can go a long way in terms of easing sore muscles in the back. But a professional massage is also recommended. Not only is it a way to target areas that are causing pain, but it can be a nice break and a way to incorporate self-care into a postpartum routine.  Hot and Cold Therapy Many people gravitate towards using either heat or ice to relieve pain, but the best thing to do is to alternate between the two to minimize pain and discomfort. When you notice back pain beginning, even if you are still in the hospital, you can start with cold therapy. Use a cold compress, a bag of ice, or even frozen vegetables onto your lower back. Be sure to wrap this in a towel to prevent it from making direct contact with bare skin. You can do this as often as you'd like, but not for more than 20 minutes at a time. After a few days of ice therapy, switch to using heat to soothe your back. You can use a heating pad, a warm compress, or a warm bath to do so. Even a sock filled with uncooked rice and microwaved can be a great way to do this.  Rest This can be difficult if you have a new baby at home, but the more you can rest your back, the better luck you will have eliminating back pain. In this case, rest can mean avoiding strenuous activities as well as actually lying in bed. It can help to have a supportive pillow placed under your knees when you're lying down to avoid additional strain on your back.  Exercise While rest is important, you  may also need to implement some movement as a way to reduce pain. Low-impact exercises are best and are usually what you will be cleared for in the early postpartum period. Core exercises that strengthen your pelvic area and abdomen are good options, as well as yoga, according to the [facility].  Over-the-Counter Medications If you feel your back pain is hard to deal with, you can use an over-the-counter medication like ibuprofen . You may have even been sent home with some from your obstetrician. If you are breastfeeding or have any prescribed medications, always check with your doctor before taking over-the-counter medications.  Thank you for choosing [facility].   Please call [phone] with any questions about today's appointment.  Please arrive at least 15 minutes prior to your scheduled appointments.   If you had blood work today, I will send you a MyChart message or a letter if results are normal. Otherwise, I will give you a call.   If you had a referral placed, they will call you to set up an appointment. Please give us  a call if you don't hear back in the next 2 weeks.   If you need additional refills before your next appointment, please call your pharmacy first.   Do you need your medications delivered to your  home?   We'll send your prescription to the [facility] for delivery.          Address: [address] [place], [place], [place] [address]          Phone: [phone]  Please call the [facility] to speak with a pharmacist and set up your home medication delivery. If you have any questions, feel free to contact us  -- we're happy to help!  Other [facility] that offer affordable prices on both prescriptions and over-the-counter items, as well as convenient services like vaccinations, are  [facility], at [facility]         Address:  [address], [place], [place] [address]         Phone: [phone]  [facility], located in the [facility]        Address: [address] [place], [place] [address]        Phone: [phone]  [facility], at [facility]       Address: [address], [place], [place] [address]       Phone: [phone]  [facility], at [facility]       Address: [address], [place], [place] [address]       Phone: [phone]  You should follow up in our clinic in Return if symptoms worsen or fail to improve.  Gloriann Ogren, MD Family Medicine

## 2024-04-29 ENCOUNTER — Ambulatory Visit: Admitting: Family Medicine

## 2024-04-29 ENCOUNTER — Encounter: Payer: Self-pay | Admitting: Family Medicine

## 2024-04-29 VITALS — BP 109/86 | HR 89 | Ht 62.0 in | Wt 197.5 lb

## 2024-04-29 DIAGNOSIS — R030 Elevated blood-pressure reading, without diagnosis of hypertension: Secondary | ICD-10-CM

## 2024-04-29 NOTE — Patient Instructions (Signed)
 It was wonderful to see you today!  Your blood pressure today is within the appropriate range.  We will continue to monitor this at future visits but for now I do not think there is any risk of postpartum eclampsia.  If you continue to have headaches you may use Tylenol  and ibuprofen .  You should also try to maintain appropriate hydration and good sleep habits as much as possible.  Please call [phone] with any questions about today's appointment.   If you need any additional refills, please call your pharmacy before calling the office.  Lucie Pinal, DO Family Medicine

## 2024-04-29 NOTE — Progress Notes (Signed)
    SUBJECTIVE:   CHIEF COMPLAINT / HPI:   BP recheck, recently pregnant and breast feeding No longer breast feeding due to personal choice. Having intermittent headaches that resolve on their own. Has not used tylenol  or ibuprofen , denies any blurry vision or chest pain.  PERTINENT  PMH / PSH: recently pregnant  OBJECTIVE:   BP 109/86   Pulse 89   Ht 5' 2 (1.575 m)   Wt 197 lb 8 oz (89.6 kg)   LMP 04/25/2024   SpO2 100%   BMI 36.12 kg/m   General: A&O, NAD Cardiac: RRR, no m/r/g Respiratory: CTAB, normal WOB, no w/c/r Extremities: NTTP, no peripheral edema.   ASSESSMENT/PLAN:   Assessment & Plan Elevated blood pressure reading without diagnosis of hypertension -BP in office today wnl, no red flag signs -advised headache prevention measures and OTC medication for pain relief -CTM, no concern for postpartum preE or eclampsia at this time.    Lucie Pinal, DO [facility]

## 2024-05-09 ENCOUNTER — Encounter: Payer: Self-pay | Admitting: Student

## 2024-05-09 ENCOUNTER — Ambulatory Visit (INDEPENDENT_AMBULATORY_CARE_PROVIDER_SITE_OTHER): Admitting: Student

## 2024-05-09 VITALS — BP 125/85 | HR 85 | Ht 63.0 in | Wt 195.6 lb

## 2024-05-09 DIAGNOSIS — Z30011 Encounter for initial prescription of contraceptive pills: Secondary | ICD-10-CM

## 2024-05-09 MED ORDER — NORGESTIMATE-ETH ESTRADIOL 0.25-35 MG-MCG PO TABS: 1.0000 | ORAL_TABLET | Freq: Every day | ORAL | 11 refills | Status: DC

## 2024-05-09 NOTE — Assessment & Plan Note (Signed)
 Eligible for oral contraceptives postpartum. Previous Depo-Provera use caused adverse effects. Interested in Sprintec due to lower estrogen. - Prescribed Sprintec, sent prescription to [facility] on [address]. - Discussed potential side effects: nausea, headache, breast tenderness.

## 2024-05-09 NOTE — Patient Instructions (Signed)
 Pleasure to see you today.  Congratulations on your baby.  Today I started you on Sprintec's which is an oral contraceptives.  Sometimes you might have nausea, headache or breast tenderness with any contraceptives.  If you have nausea this should improve over time.

## 2024-05-09 NOTE — Progress Notes (Signed)
    SUBJECTIVE:   CHIEF COMPLAINT / HPI:   Molly Mendez is a 23 year old female with polycystic ovary syndrome who presents for a consultation regarding birth control options.  She is two months postpartum and formula feeding. She seeks birth control options that do not exacerbate mood swings or contribute to weight gain. Previous use of Depo-Provera resulted in severe bleeding and mood swings, while an oral contraceptive caused nausea and mood swings. She is concerned about hormonal contraceptives affecting her mood and weight. She is not currently on medication for polycystic ovary syndrome. No history of Migraine or thrombotic emboli.  PERTINENT  PMH / PSH: Reviewed  OBJECTIVE:   BP 125/85   Pulse 85   Ht 5' 3 (1.6 m)   Wt 195 lb 9.6 oz (88.7 kg)   LMP 04/25/2024   SpO2 99%   BMI 34.65 kg/m    Physical Exam General: Alert, well appearing, NAD Cardiovascular: RRR, No Murmurs, Normal S2/S2 Respiratory: CTAB, No wheezing or Rales Abdomen: No distension or tenderness Extremities: No edema on extremities    ASSESSMENT/PLAN:   Encounter for initial prescription of contraceptive pills Eligible for oral contraceptives postpartum. Previous Depo-Provera use caused adverse effects. Interested in Sprintec due to lower estrogen. - Prescribed Sprintec, [facility] [address]. - Discussed potential side effects: nausea, headache, breast tenderness.   Norleen April, MD [facility]

## 2024-05-12 ENCOUNTER — Encounter: Payer: Self-pay | Admitting: Family Medicine

## 2024-05-13 ENCOUNTER — Encounter: Payer: Self-pay | Admitting: Family Medicine

## 2024-05-13 ENCOUNTER — Ambulatory Visit: Admitting: Family Medicine

## 2024-05-13 VITALS — BP 111/80 | HR 95 | Ht 63.0 in | Wt 194.6 lb

## 2024-05-13 DIAGNOSIS — N926 Irregular menstruation, unspecified: Secondary | ICD-10-CM | POA: Diagnosis not present

## 2024-05-13 DIAGNOSIS — H65111 Acute and subacute allergic otitis media (mucoid) (sanguinous) (serous), right ear: Secondary | ICD-10-CM | POA: Diagnosis present

## 2024-05-13 MED ORDER — FLUTICASONE PROPIONATE 50 MCG/ACT NA SUSP: 2.0000 | Freq: Every day | NASAL | 6 refills | Status: DC

## 2024-05-13 MED ORDER — AZELASTINE HCL 0.1 % NA SOLN: 1.0000 | Freq: Two times a day (BID) | NASAL | 12 refills | Status: DC

## 2024-05-13 NOTE — Patient Instructions (Addendum)
 It was wonderful to see you today!  Most likely the bleeding you are experiencing is related to normal hormonal shifts that come after pregnancy.  Because you recently stopped breast-feeding, your body is going to respond by shifting away from producing the hormones that promote breast-feeding and shifting towards the hormones that promote the normal menstrual cycle.  The women in your situation experience multiple instances of irregular bleeding in the months following the end of breast-feeding.  Starting taking your combined oral contraceptive pill will help regulate these hormones and stop you from having too many instances of bleeding.  If you experience prolonged heavy bleeding for greater than 2 weeks, you start to feel lightheaded or increased fatigue, or you have a concern for anemia please schedule an appointment for further testing.  Otherwise as your hormones begin to self regulate you should experience more normal menstrual cycles.  For your ear pain I have prescribed intranasal Flonase  and an antihistamine called azelastine .  Use the Flonase  once a day, and the Astepro  twice a day.  Please follow the instructions on the prescription for dosing.  If your ear pain does not improve over the next week, please schedule a follow-up appointment.  Please call [phone] with any questions about today's appointment.   If you need any additional refills, please call your pharmacy before calling the office.  Lucie Pinal, DO Family Medicine

## 2024-05-13 NOTE — Progress Notes (Cosign Needed Addendum)
    SUBJECTIVE:   CHIEF COMPLAINT / HPI:   New bleeding in a post partum patient. Patient reports normal.  Starting on August 1 which ended completely.  Then on Friday, she noticed some dark brown discharge which has increased in flow since then.  She has also noted some cramping.  Patient recently stopped breast-feeding and was prescribed combined OCPs, however she has not yet started taking them.  Since Sunday she has also had right sided ear pain with headache and dizziness.  It is somewhat helped with ibuprofen /Tylenol  but not completely.  She denies fever or chills.  PERTINENT  PMH / PSH: History of PCOS, two months postpartum  OBJECTIVE:   BP 111/80   Pulse 95   Ht 5' 3 (1.6 m)   Wt 194 lb 9.6 oz (88.3 kg)   LMP 04/25/2024   SpO2 98%   Breastfeeding No   BMI 34.47 kg/m   General: Well-appearing female, no distress HEENT: Nontender maxillary and frontal sinuses, PERRLA EOMI.  Right ear with clear effusion behind the drum, mild bulging but no erythema or purulence noted.  Some cerumen in the [facility] but otherwise normal.  Left ear and TM intact and normal. GU: Molly Mendez present as chaperone.  Normal-appearing external female genitalia.  Some dark red blood present at the vaginal introitus.  Vaginal Molly Mendez is nonerythematous, moist and rugated.  Copious dark red blood in the vaginal vault and coming through the cervical os.  No other findings noted.  ASSESSMENT/PLAN:   Assessment & Plan Non-recurrent acute allergic otitis media of right ear - Begin daily Flonase , 2 sprays each nostril -Begin twice daily Astepro , 1 spray each nostril -Continue over-the-counter pain control, recommended meclizine for dizziness if persistent -Patient will follow-up if symptoms do not improve within 1 week -At that time would consider antibiotic therapy if appropriate Menstrual irregularity - Likely secondary to normal changes in hormone up regulation in the postpartum period, especially  considering patient recently stopped breast-feeding. -Provided reassurance, advised to beginning to take Sprintec -Provided return precautions   Molly Pinal, DO [facility]

## 2024-07-15 NOTE — Progress Notes (Unsigned)
    SUBJECTIVE:   CHIEF COMPLAINT / HPI:   Feeling a bulge when she sits down, has been going on since her vaginal delivery.  Reports it worsens with jump roping and associated with some urinary incontinence when jump roping.  Sexual activity is painful.  States it feels like she has a tampon in always.  She is 4 months postpartum. Also intermittent pelvic pain.  No fever, vaginal bleeding, vaginal discharge.  Her menses has been irregular since delivering.  She is not breast-feeding.  PERTINENT  PMH / PSH: reviewed  OBJECTIVE:   BP 132/89   Pulse 70   Ht 5' 3 (1.6 m)   Wt 191 lb 9.6 oz (86.9 kg)   SpO2 99%   BMI 33.94 kg/m   Gen: well appearing, NAD Abd: soft, non-distended. Generalized TTP, worse in suprapubic region.  Pelvic - [place], CMA present. VULVA: normal appearing vulva with no masses, tenderness or lesions.  Unable to insert speculum due to pain.  Appears to be vaginal wall prolapse. No outward bugling.   ASSESSMENT/PLAN:   Assessment & Plan Pelvic pain Given intermittent pelvic pain will plan for pelvic ultrasound.  Reassuringly no fevers, benign abdomen today.  Upreg was negative. Vaginal prolapse History and exam sound consistent with vaginal prolapse, unable to fully visualize on exam today Sent referral to urogynecology and pelvic floor PT for further evaluation and treatment     Elyce Prescott, DO [facility]

## 2024-07-16 ENCOUNTER — Encounter: Payer: Self-pay | Admitting: Family Medicine

## 2024-07-16 ENCOUNTER — Ambulatory Visit (INDEPENDENT_AMBULATORY_CARE_PROVIDER_SITE_OTHER): Admitting: Family Medicine

## 2024-07-16 ENCOUNTER — Ambulatory Visit: Payer: Self-pay | Admitting: Family Medicine

## 2024-07-16 VITALS — BP 132/89 | HR 70 | Ht 63.0 in | Wt 191.6 lb

## 2024-07-16 DIAGNOSIS — N8110 Cystocele, unspecified: Secondary | ICD-10-CM | POA: Diagnosis not present

## 2024-07-16 DIAGNOSIS — R102 Pelvic and perineal pain unspecified side: Secondary | ICD-10-CM

## 2024-07-16 DIAGNOSIS — N926 Irregular menstruation, unspecified: Secondary | ICD-10-CM | POA: Diagnosis not present

## 2024-07-16 LAB — POCT URINE PREGNANCY: Preg Test, Ur: NEGATIVE

## 2024-07-16 NOTE — Patient Instructions (Signed)
 Good to see you today - Thank you for coming in  Things we discussed today:  I am concerned you may have vaginal prolapse. I have sent a referral for pelvic floor PT and to urogynecology.  We are scheduling an abdominal ultrasound to better assess your pain.

## 2024-07-17 ENCOUNTER — Other Ambulatory Visit: Payer: Self-pay

## 2024-07-17 ENCOUNTER — Ambulatory Visit: Attending: Family Medicine | Admitting: Physical Therapy

## 2024-07-17 DIAGNOSIS — R293 Abnormal posture: Secondary | ICD-10-CM | POA: Insufficient documentation

## 2024-07-17 DIAGNOSIS — N8110 Cystocele, unspecified: Secondary | ICD-10-CM | POA: Diagnosis not present

## 2024-07-17 DIAGNOSIS — M6281 Muscle weakness (generalized): Secondary | ICD-10-CM | POA: Diagnosis present

## 2024-07-17 DIAGNOSIS — R279 Unspecified lack of coordination: Secondary | ICD-10-CM | POA: Insufficient documentation
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
## 2024-07-18 ENCOUNTER — Ambulatory Visit (HOSPITAL_COMMUNITY)

## 2024-07-31 ENCOUNTER — Ambulatory Visit (HOSPITAL_COMMUNITY)
Admission: RE | Admit: 2024-07-31 | Discharge: 2024-07-31 | Disposition: A | Discharge status: Home / Self Care | Source: Ambulatory Visit | Attending: Family Medicine | Admitting: Family Medicine

## 2024-07-31 DIAGNOSIS — R102 Pelvic and perineal pain unspecified side: Secondary | ICD-10-CM | POA: Diagnosis present

## 2024-09-03 ENCOUNTER — Ambulatory Visit: Attending: Family Medicine

## 2024-09-03 DIAGNOSIS — R279 Unspecified lack of coordination: Secondary | ICD-10-CM | POA: Insufficient documentation

## 2024-09-03 DIAGNOSIS — R293 Abnormal posture: Secondary | ICD-10-CM | POA: Diagnosis present

## 2024-09-03 DIAGNOSIS — M6281 Muscle weakness (generalized): Secondary | ICD-10-CM | POA: Diagnosis present
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
## 2024-09-10 ENCOUNTER — Ambulatory Visit: Admitting: Physical Therapy

## 2024-09-17 ENCOUNTER — Ambulatory Visit: Admitting: Physical Therapy

## 2024-09-17 DIAGNOSIS — R279 Unspecified lack of coordination: Secondary | ICD-10-CM

## 2024-09-17 DIAGNOSIS — M6281 Muscle weakness (generalized): Secondary | ICD-10-CM | POA: Diagnosis not present

## 2024-09-17 DIAGNOSIS — R293 Abnormal posture: Secondary | ICD-10-CM
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
## 2024-09-23 ENCOUNTER — Ambulatory Visit: Payer: Self-pay | Admitting: Family Medicine

## 2024-09-23 ENCOUNTER — Encounter: Payer: Self-pay | Admitting: Family Medicine

## 2024-09-23 VITALS — BP 118/78 | HR 63 | Ht 63.0 in | Wt 192.4 lb

## 2024-09-23 DIAGNOSIS — Z3490 Encounter for supervision of normal pregnancy, unspecified, unspecified trimester: Secondary | ICD-10-CM | POA: Diagnosis present

## 2024-09-23 LAB — POCT URINE PREGNANCY: Preg Test, Ur: POSITIVE — AB

## 2024-09-23 NOTE — Progress Notes (Signed)
" ° ° °  SUBJECTIVE:   CHIEF COMPLAINT / HPI:   Positive home pregnancy test: - patient delivered earlier this year a healthy baby girl -First Day LMP 11/3, though periods have been inconsistent since delivery in June - had some spotting about a month and a half ago - light cramping 4 days ago, thought maybe it was prolapse - no other current sx. - patient is currently G2P1011  PERTINENT  PMH / PSH: Pregnancy  OBJECTIVE:   BP 118/78   Pulse 63   Ht 5' 3 (1.6 m)   Wt 192 lb 6 oz (87.3 kg)   LMP 07/28/2024   SpO2 100%   Breastfeeding No   BMI 34.08 kg/m   General: A&O, NAD Cardiac: RRR, no m/r/g Respiratory: CTAB, normal WOB, no w/c/r  ASSESSMENT/PLAN:   Assessment & Plan Pregnancy, unspecified gestational age - pregnancy confirmed in office today, dating US  scheduled given uncertainty -initial prenatal labs collected, will review at initial appointment in 1-2 weeks -patient prefers to see PCP for prenatal care as much as possible, and would like to avoid seeing a female provider as able.    Lucie Pinal, DO  [facility] "

## 2024-09-23 NOTE — Patient Instructions (Addendum)
 Congratulations on your pregnancy!  You will need to have an ultrasound to figure out how far along you are, and we will schedule your initial prenatal visit with me in 1-2 weeks. The exact date will be listed later in the packet. Below is a list of medications that are safe to take in pregnancy. If you do not see a medicine listed and have a question, please don't hesitate to reach out.   Safe Medications in Pregnancy   Acne:  Benzoyl Peroxide  Salicylic Acid   Backache/Headache:  Tylenol : 2 regular strength every 4 hours OR               2 extra strength every 6 hours  ** Do not exceed 4000 mg of tylenol  in 24 hours   Colds/Coughs/Allergies:  Benadryl  (alcohol free) 25 mg every 6 hours as needed  Breath right strips  Claritin  Cepacol throat lozenges  Chloraseptic throat spray  Cold-Eeze- up to three times per day  Cough drops, alcohol free  Flonase  (by prescription only)  Guaifenesin  Mucinex  Robitussin DM (plain only, alcohol free)  Saline nasal spray/drops  Sudafed (pseudoephedrine) & Actifed * use only after [redacted] weeks gestation and if you do not have high blood pressure  Tylenol   Vicks Vaporub  Zinc lozenges  Zyrtec   Constipation:  Colace  Ducolax suppositories  Fleet enema  Glycerin  suppositories  Metamucil  Milk of magnesia  Miralax  Senokot  Smooth move tea   Diarrhea:  Kaopectate  Imodium A-D  **NO pepto Bismol   Hemorrhoids:  Anusol  Anusol HC  Preparation H  Tucks   Indigestion:  Tums  Maalox  Mylanta  Zantac  Pepcid    Insomnia:  Benadryl  (alcohol free) 25mg  every 6 hours as needed  Tylenol  PM  Unisom , no Gelcaps   Leg Cramps:  Tums  MagGel   Nausea/Vomiting:  Bonine  Dramamine  Emetrol  Ginger extract  Sea bands  Meclizine  Nausea medication to take during pregnancy:  Unisom  (doxylamine  succinate 25 mg tablets) Take one tablet daily at bedtime. If symptoms are not adequately controlled, the dose can be increased to a  maximum recommended dose of two tablets daily (1/2 tablet in the morning, 1/2 tablet mid-afternoon and one at bedtime).  Vitamin B6 100mg  tablets. Take one tablet twice a day (up to 200 mg per day).   Skin Rashes:  Aveeno products  Benadryl  cream or 25mg  every 6 hours as needed  Calamine Lotion  1% cortisone cream   Yeast infection:  Gyne-lotrimin 7  Monistat  7   **If taking multiple medications, please check labels to avoid duplicating the same active ingredients  **Take medications as directed on the label  **Do not take medications that contain ibuprofen  or aspirin without discussing with your physician

## 2024-09-24 ENCOUNTER — Ambulatory Visit

## 2024-09-27 LAB — MICROSCOPIC EXAMINATION
Bacteria, UA: NONE SEEN
Casts: NONE SEEN /LPF

## 2024-09-27 LAB — PREGNANCY, INITIAL SCREEN
Antibody Screen: NEGATIVE
Basophils Absolute: 0 x10E3/uL (ref 0.0–0.2)
Basos: 0 %
Bilirubin, UA: NEGATIVE
Chlamydia trachomatis, NAA: NEGATIVE
EOS (ABSOLUTE): 0.1 x10E3/uL (ref 0.0–0.4)
Eos: 1 %
Glucose, UA: NEGATIVE
HCV Ab: NONREACTIVE
HIV Screen 4th Generation wRfx: NONREACTIVE
Hematocrit: 40.8 % (ref 34.0–46.6)
Hemoglobin: 13.1 g/dL (ref 11.1–15.9)
Hepatitis B Surface Ag: NEGATIVE
Immature Grans (Abs): 0 x10E3/uL (ref 0.0–0.1)
Immature Granulocytes: 0 %
Ketones, UA: NEGATIVE
Lymphocytes Absolute: 2.2 x10E3/uL (ref 0.7–3.1)
Lymphs: 22 %
MCH: 27.7 pg (ref 26.6–33.0)
MCHC: 32.1 g/dL (ref 31.5–35.7)
MCV: 86 fL (ref 79–97)
Monocytes Absolute: 0.7 x10E3/uL (ref 0.1–0.9)
Monocytes: 7 %
Neisseria Gonorrhoeae by PCR: NEGATIVE
Neutrophils Absolute: 6.8 x10E3/uL (ref 1.4–7.0)
Neutrophils: 70 %
Nitrite, UA: NEGATIVE
Platelets: 281 x10E3/uL (ref 150–450)
Protein,UA: NEGATIVE
RBC, UA: NEGATIVE
RBC: 4.73 x10E6/uL (ref 3.77–5.28)
RDW: 12.7 % (ref 11.7–15.4)
RPR Ser Ql: NONREACTIVE
Rh Factor: POSITIVE
Rubella Antibodies, IGG: 2.35 {index}
Specific Gravity, UA: 1.023 (ref 1.005–1.030)
Urobilinogen, Ur: 1 mg/dL (ref 0.2–1.0)
WBC: 9.8 x10E3/uL (ref 3.4–10.8)
pH, UA: 5.5 (ref 5.0–7.5)

## 2024-09-27 LAB — URINE CULTURE, OB REFLEX

## 2024-09-27 LAB — HCV INTERPRETATION

## 2024-09-28 ENCOUNTER — Ambulatory Visit: Payer: Self-pay | Admitting: Family Medicine

## 2024-09-30 ENCOUNTER — Ambulatory Visit (HOSPITAL_COMMUNITY)
Admission: RE | Admit: 2024-09-30 | Discharge: 2024-09-30 | Disposition: A | Discharge status: Home / Self Care | Source: Ambulatory Visit | Attending: Family Medicine | Admitting: Family Medicine

## 2024-09-30 DIAGNOSIS — Z3A08 8 weeks gestation of pregnancy: Secondary | ICD-10-CM | POA: Insufficient documentation

## 2024-09-30 DIAGNOSIS — O208 Other hemorrhage in early pregnancy: Secondary | ICD-10-CM | POA: Diagnosis not present

## 2024-09-30 DIAGNOSIS — Z3490 Encounter for supervision of normal pregnancy, unspecified, unspecified trimester: Secondary | ICD-10-CM

## 2024-10-01 ENCOUNTER — Other Ambulatory Visit: Payer: Self-pay

## 2024-10-01 ENCOUNTER — Ambulatory Visit: Admitting: Physical Therapy

## 2024-10-01 ENCOUNTER — Ambulatory Visit: Payer: Self-pay

## 2024-10-01 VITALS — BP 116/78 | HR 74 | Wt 193.6 lb

## 2024-10-01 DIAGNOSIS — Z3A09 9 weeks gestation of pregnancy: Secondary | ICD-10-CM

## 2024-10-01 DIAGNOSIS — Z9189 Other specified personal risk factors, not elsewhere classified: Secondary | ICD-10-CM

## 2024-10-01 DIAGNOSIS — Z3490 Encounter for supervision of normal pregnancy, unspecified, unspecified trimester: Secondary | ICD-10-CM | POA: Insufficient documentation

## 2024-10-01 MED ORDER — ASPIRIN 81 MG PO TBEC: DELAYED_RELEASE_TABLET | ORAL | 12 refills | Status: AC

## 2024-10-01 NOTE — Progress Notes (Signed)
 " Patient Name: Molly Mendez Date of Birth: 08/22/2001 [facility] Initial Prenatal Visit  Molly Mendez is a 24 y.o. year old G3P1011 at Unknown who presents for her initial prenatal visit. Pregnancy is planned, although she conceived earlier than anticipated She reports lightheadedness, some mild nausea in the morning. Drinking plenty of water  and snacking throughout the day. Primarily feels lightheaded if she has not eaten recently. Not positional She is taking a prenatal vitamin. Started taking after last office visit. She denies pelvic pain or vaginal bleeding. Had very light vaginal bleeding after intercourse on Sunday.  Flu shot-prefers to defer this to next visit  Pregnancy Dating: The patient is dated by LMP.  LMP: 11/3 Period is certain:  Yes.  Periods were regular:  Yes. Had one late cycle in the Fall but otherwise regular LMP was a typical period:  Yes.  Using hormonal contraception in 3 months prior to conception: No  Lab Review: Blood type: A Rh Status: + Antibody screen: Negative HIV: Negative RPR: Negative Hemoglobin electrophoresis reviewed: Yes- completed during prior pregnancy, normal Results of OB urine culture are: Positive for lactobacillus Rubella: Immune Hep C Ab: Negative Varicella status is Not immune- did not receive after last pregnancy  PMH: Reviewed and as detailed below: HTN: No  Gestational Hypertension/preeclampsia: No  Type 1 or 2 Diabetes: No  Depression:  Yes - as a teen, not recently Seizure disorder:  No VTE: No ,  History of STI No,  Abnormal Pap smear:  No, Genital herpes simplex:  No   PSH: Gynecologic Surgery:  no Surgical history reviewed, notable for: eye muscle surgery as a child  Obstetric History: Obstetric history tab updated and reviewed.  Summary of prior pregnancies: now G3P1011, delivered live female 03/06/24 at [redacted]w[redacted]d, induction due to cholestasis, vacuum assisted vaginal  delivery due to prolonged fetal bradycardia Cesarean delivery: No  Gestational Diabetes:  No Hypertension in pregnancy: No History of preterm birth: No History of LGA/SGA infant:  No History of shoulder dystocia: No Indications for referral were reviewed, and the patient has no obstetric indications for referral to High Risk OB Clinic at this time.   Social History: Partner's name: Molly Mendez  Tobacco use: No Alcohol use:  No Other substance use:  No  Financial Assistance:  Adopt A Mom Patient?: No  If AAM, have they contacted Eric to enroll in [employer] Assistance?: N/A  Current Medications:  Prenatal vitamin  Reviewed and appropriate in pregnancy.   Genetic and Infection Screen: Flow Sheet Updated Yes  Prenatal Exam: Gen: Well nourished, well developed.  No distress.  Vitals noted. HEENT: Normocephalic, atraumatic.  Neck supple without visible masses CV: RRR no murmur, gallops or rubs Lungs: CTA B.  Normal respiratory effort without wheezes or rales. Abd: soft, NTND. +BS.  Uterus not appreciated above pelvis. Ext: No clubbing, cyanosis or edema. Psych: Normal grooming and dress.  Not depressed or anxious appearing.  Normal thought content and process without flight of ideas or looseness of associations  Fetal heart tones: N/A, <[redacted] weeks GA*  Assessment/Plan:  Molly Mendez is a 24 y.o. G3P1011 at Unknown who presents to initiate prenatal care. She is doing well.    Routine prenatal care: As dating is not reliable, a dating ultrasound has been done- pending report. Dating tab updated. Pre-pregnancy weight updated. Expected weight gain this pregnancy is 25-35 pounds  Prenatal labs reviewed, notable for varicella non immune. Indications for referral to [facility]  were reviewed and the patient does not meet criteria for referral.  Medication list reviewed and updated.  Recommended patient see a dentist for regular care.  Bleeding and pain precautions  reviewed. Importance of prenatal vitamins reviewed.  Genetic screening offered. Patient opted for: NIPT. To be ordered at next visit. The patient has the following indications for aspirinto begin 81 mg at 12-16 weeks: One high risk condition: no single high risk condition  MORE than one moderate risk condition: obesity and low SES Aspirin  was  recommended today based upon above risk factors (one high risk condition or more than one moderate risk factor)  The patient will not be age 64 or over at time of delivery. Referral to genetic counseling was not offered today.  The patient has the following risk factors for preexisting diabetes: BMI > 25 and high risk ethnicity (Latino, African American, Native American, Pacific Islander, Asian Naval Architect) . An early 1 hour glucose tolerance test should be  ordered at next visit. Pt would like the flu vaccine at her next visit Pregnancy Medical Home and PHQ-9 forms completed, problems noted: Yes  2. Pregnancy issues include the following which were addressed today:  Lightheadedness between meals, pt is snacking regularly and is managing well.   Follow up 4 weeks for next prenatal visit.     "

## 2024-10-01 NOTE — Patient Instructions (Signed)
 Thank you for coming in today! Here is a summary of what we discussed:  Congrats on your pregnancy! For next time: --cell free DNA testing after 10 weeks --start low dose aspirin  after 12 weeks --diabetes test (1 hour glucose drink)  Please call the clinic at [phone] if your symptoms worsen or you have any concerns.  Best, Dr Adele  Please make a follow up appointment in 4 weeks.  Prenatal Classes Go to [email or web address] for more information on the pregnancy and child birth classes that [facility] has to offer.   Financial Assistance To enroll in [facility], please call [phone] Ask to speak with Eric. He speaks Spanish.  Eric is located at: [address] [place], [place]  His office is open Tuesday, Wednesday, Thursday from 8:30a to 4:30p (Closed Monday & Friday)  Vaccinations If you are with the [employer] Program and need vaccines, these are done the [facility] on [address] [place]. The number to call to schedule an appt is [phone]  Pregnancy Related Return Precautions The follow are signs/symptoms that are abnormal in pregnancy and may require further evaluation by a physician: Go to the MAU at [facility] at [facility] if: You have cramping/contractions that do not go away with drinking water , especially if they are lasting 30 seconds to 1.5 minutes, coming and going every 5-10 minutes for an hour or more, or are getting stronger and you cannot walk or talk while having a contraction/cramp. Your water  breaks.  Sometimes it is a big gush of fluid, sometimes it is just a trickle that keeps getting your underwear wet or running down your legs You have vaginal bleeding.    You do not feel your baby moving like normal.  If you do not, get something to eat and drink (something cold or something with sugar like peanut butter or juice) and lay down  and focus on feeling your baby move. If your baby is still not moving like normal, you should go to MAU. You should feel your baby move 6 times in one hour, or 10 times in two hours. You have a persistent headache that does not go away with 1 g of Tylenol , vision changes, chest pain, difficulty breathing, severe pain in your right upper abdomen, worsening leg swelling- these can all be signs of high blood pressure in pregnancy and need to be evaluated by a provider immediately  These are all concerning in pregnancy and if you have any of these I recommend you call your PCP and present to the Maternity Admissions Unit (map below) for further evaluation.  For any pregnancy-related emergencies, please go to the Maternity Admissions Unit in the [facility] at [facility]. You will use hospital Entrance C.    Our clinic number is [phone].   Dr Donzetta

## 2024-10-01 NOTE — Assessment & Plan Note (Addendum)
 Molly Mendez

## 2024-10-08 ENCOUNTER — Ambulatory Visit: Admitting: Physical Therapy

## 2024-10-15 ENCOUNTER — Ambulatory Visit: Admitting: Physical Therapy

## 2024-10-22 ENCOUNTER — Ambulatory Visit: Payer: Self-pay | Admitting: Family Medicine

## 2024-10-22 ENCOUNTER — Ambulatory Visit: Admitting: Physical Therapy

## 2024-10-22 ENCOUNTER — Other Ambulatory Visit: Payer: Self-pay

## 2024-10-22 VITALS — BP 120/83 | HR 66 | Wt 192.8 lb

## 2024-10-22 DIAGNOSIS — Z3A11 11 weeks gestation of pregnancy: Secondary | ICD-10-CM | POA: Diagnosis not present

## 2024-10-22 NOTE — Progress Notes (Addendum)
" °  Patient Name: Molly Mendez Date of Birth: April 29, 2001 [facility] Prenatal Visit  Molly Mendez is a 24 y.o. G3P1011 at11wk 1d here for routine follow up. She is dated by early ultrasound.  She reports backache and nausea.  She denies vaginal bleeding.  See flow sheet for details.  Vitals:   10/22/24 0939  BP: 120/83  Pulse: 66   FHT: 135  A/P: Pregnancy at Unknown.  Doing well.    Routine Prenatal Care:  Dating reviewed, dating tab is correct Fetal heart tones Appropriate Influenza vaccine not administered, not in stock.  COVID vaccination was discussed and not administered, not in stock.  The patient has the following indication for screening preexisting diabetes: BMI > 25 and high risk ethnicity (Latino, African American, Native American, Pacific Islander, Asian Naval [employer]) . Anatomy ultrasound ordered to be scheduled at 18-20 weeks. Patient is interested in genetic screening.  Pregnancy education including expected weight gain in pregnancy, OTC medication use, continued use of prenatal vitamin, smoking cessation if applicable, and nutrition in pregnancy.   Bleeding and pain precautions reviewed. The patient has the following indications for aspirinto begin 81 mg at 12-16 weeks: One high risk condition: no single high risk condition  MORE than one moderate risk condition: obesity and low SES   Aspirin  was  recommended today based upon above risk factors (one high risk condition or more than one moderate risk factor)   2. Pregnancy issues include the following and were addressed as appropriate today:  Neck pain- likely related to lifting her daughter, advised heat and ice as needed.  Risk factors for GDM, will obtain 1hr GTT at next visit for early testing.  Problem list  and pregnancy box updated: No.   Follow up 4 weeks.  "

## 2024-10-22 NOTE — Patient Instructions (Addendum)
 Safe Medications in Pregnancy   Acne:  Benzoyl Peroxide  Salicylic Acid   Backache/Headache:  Tylenol: 2 regular strength every 4 hours OR               2 extra strength every 6 hours  ** Do not exceed 4000 mg of tylenol in 24 hours   Colds/Coughs/Allergies:  Benadryl (alcohol free) 25 mg every 6 hours as needed  Breath right strips  Claritin  Cepacol throat lozenges  Chloraseptic throat spray  Cold-Eeze- up to three times per day  Cough drops, alcohol free  Flonase (by prescription only)  Guaifenesin  Mucinex  Robitussin DM (plain only, alcohol free)  Saline nasal spray/drops  Sudafed (pseudoephedrine) & Actifed * use only after [redacted] weeks gestation and if you do not have high blood pressure  Tylenol  Vicks Vaporub  Zinc lozenges  Zyrtec   Constipation:  Colace  Ducolax suppositories  Fleet enema  Glycerin suppositories  Metamucil  Milk of magnesia  Miralax  Senokot  Smooth move tea   Diarrhea:  Kaopectate  Imodium A-D  **NO pepto Bismol   Hemorrhoids:  Anusol  Anusol HC  Preparation H  Tucks   Indigestion:  Tums  Maalox  Mylanta  Zantac  Pepcid   Insomnia:  Benadryl (alcohol free) 25mg  every 6 hours as needed  Tylenol PM  Unisom, no Gelcaps   Leg Cramps:  Tums  MagGel   Nausea/Vomiting:  Bonine  Dramamine  Emetrol  Ginger extract  Sea bands  Meclizine  Nausea medication to take during pregnancy:  Unisom (doxylamine succinate 25 mg tablets) Take one tablet daily at bedtime. If symptoms are not adequately controlled, the dose can be increased to a maximum recommended dose of two tablets daily (1/2 tablet in the morning, 1/2 tablet mid-afternoon and one at bedtime).  Vitamin B6 100mg  tablets. Take one tablet twice a day (up to 200 mg per day).   Skin Rashes:  Aveeno products  Benadryl cream or 25mg  every 6 hours as needed  Calamine Lotion  1% cortisone cream   Yeast infection:  Gyne-lotrimin 7  Monistat 7   **If taking multiple  medications, please check labels to avoid duplicating the same active ingredients  **Take medications as directed on the label  **Do not take medications that contain ibuprofen  or aspirin without discussing with your physician

## 2024-11-19 ENCOUNTER — Encounter: Payer: Self-pay | Admitting: Family Medicine

## 2024-12-18 ENCOUNTER — Encounter
